# Patient Record
Sex: Male | Born: 1980 | Race: White | Hispanic: No | Marital: Married | Smoking: Never smoker
Health system: Southern US, Community
[De-identification: ages and names within clinical notes are randomized; demographics above are authoritative.]

## PROBLEM LIST (undated history)

## (undated) DIAGNOSIS — R011 Cardiac murmur, unspecified: Secondary | ICD-10-CM

## (undated) DIAGNOSIS — T7840XA Allergy, unspecified, initial encounter: Secondary | ICD-10-CM

## (undated) DIAGNOSIS — T8092XA Unspecified transfusion reaction, initial encounter: Secondary | ICD-10-CM

## (undated) DIAGNOSIS — Q210 Ventricular septal defect: Secondary | ICD-10-CM

## (undated) HISTORY — DX: Allergy, unspecified, initial encounter: T78.40XA

## (undated) HISTORY — DX: Ventricular septal defect: Q21.0

## (undated) HISTORY — DX: Cardiac murmur, unspecified: R01.1

## (undated) HISTORY — DX: Unspecified transfusion reaction, initial encounter: T80.92XA

---
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
## 2005-08-16 ENCOUNTER — Encounter: Admission: RE | Admit: 2005-08-16 | Discharge: 2005-08-16 | Payer: Self-pay | Admitting: *Deleted

## 2007-02-10 IMAGING — CT CT HEAD WO/W CM
1 of 2 series · 13 of 30 positions shown, 17 images · non-contrast
Comparison: none

[Series 2: head · axial · 0.49mm/px · z∈[+26,+139]mm · 13 of 27 slices shown, 17 images]
[im 2/27  brain]
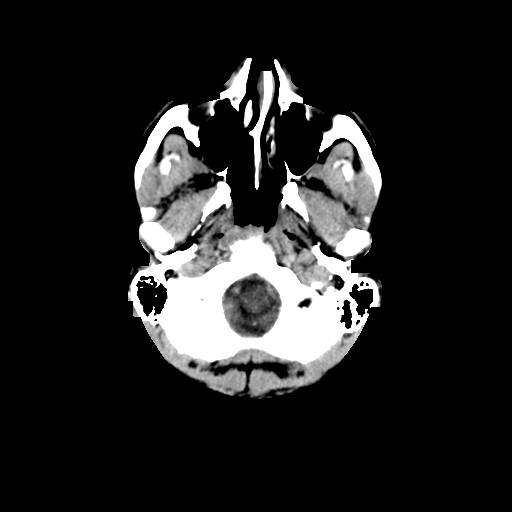
[im 2/27  bone]
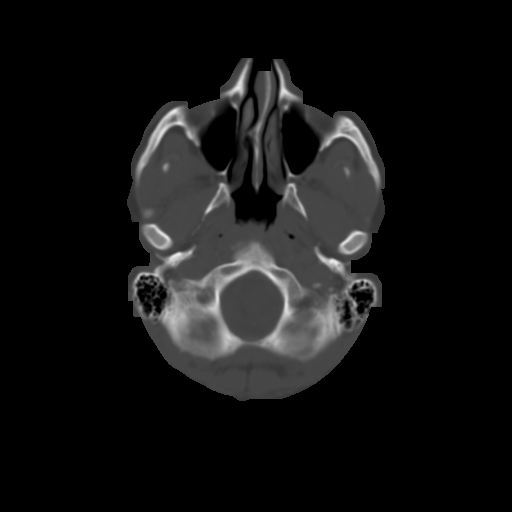
[im 4/27  brain]
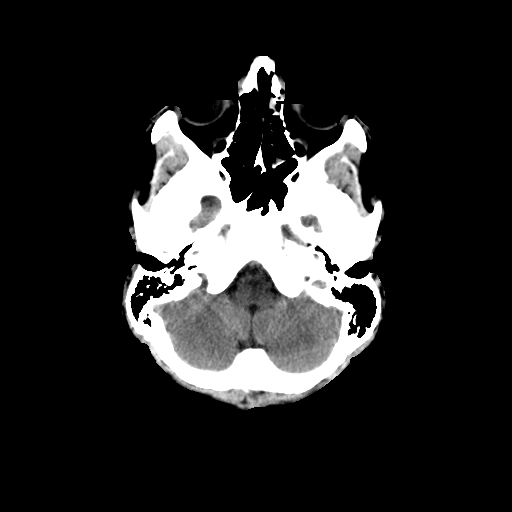
[im 6/27  brain]
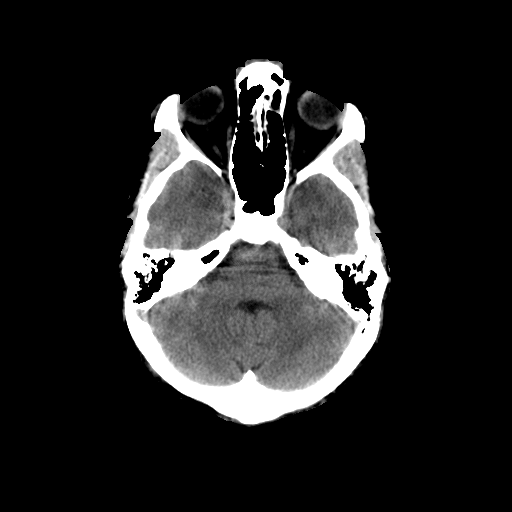
[im 8/27  brain]
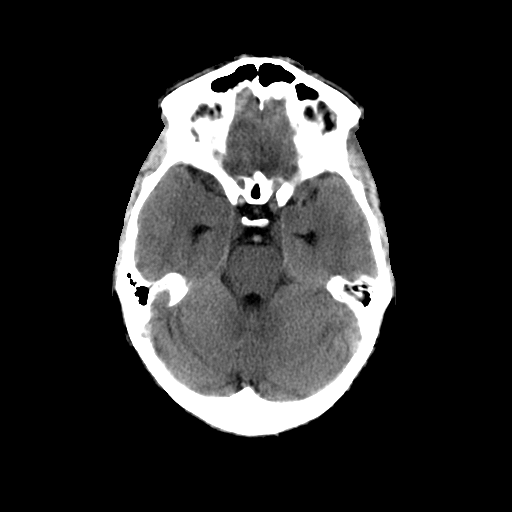
[im 10/27  brain]
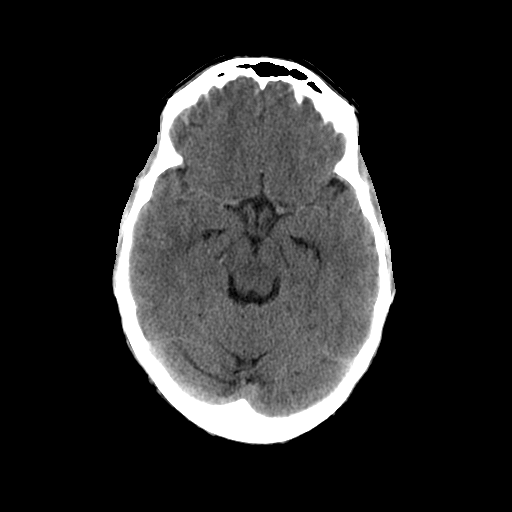
[im 10/27  bone]
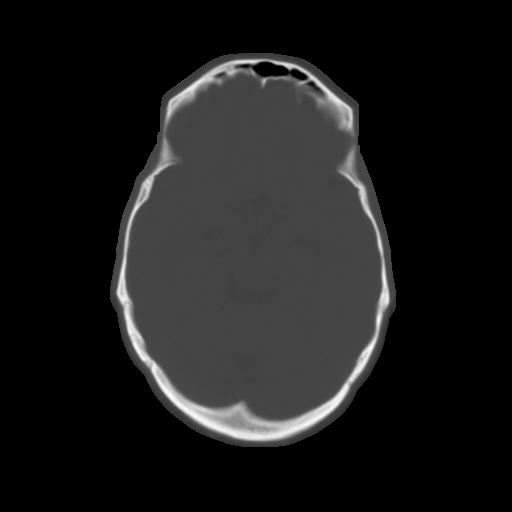
[im 12/27  brain]
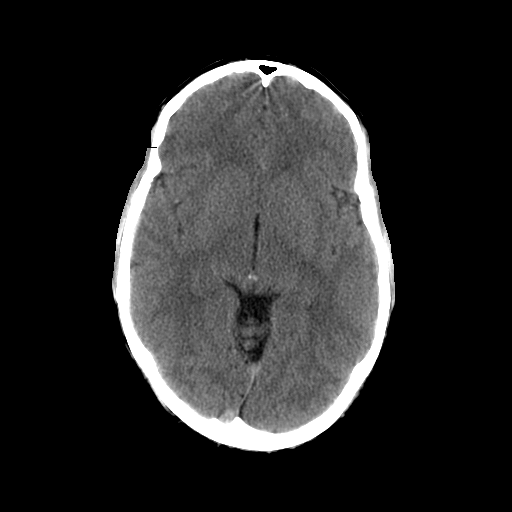
[im 14/27  brain]
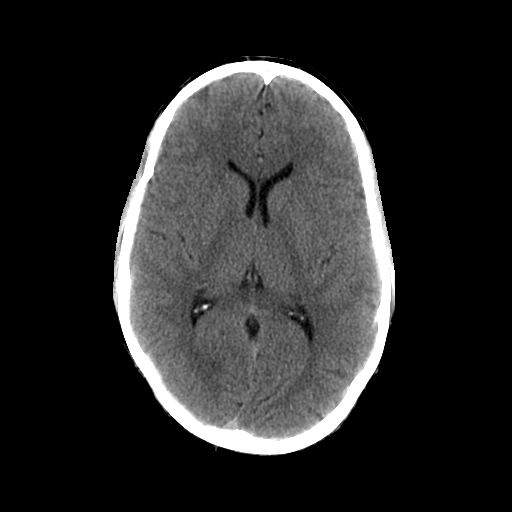
[im 15/27  brain]
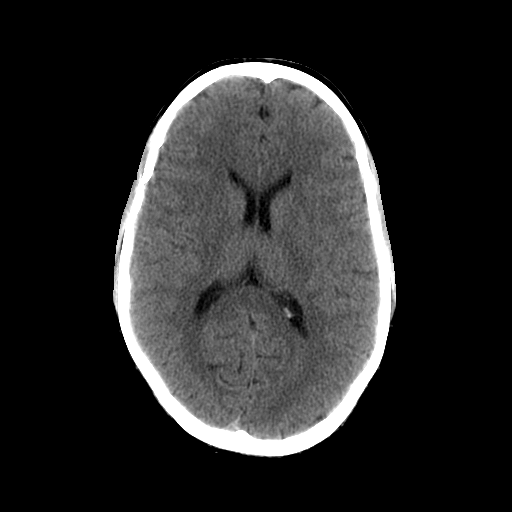
[im 17/27  brain]
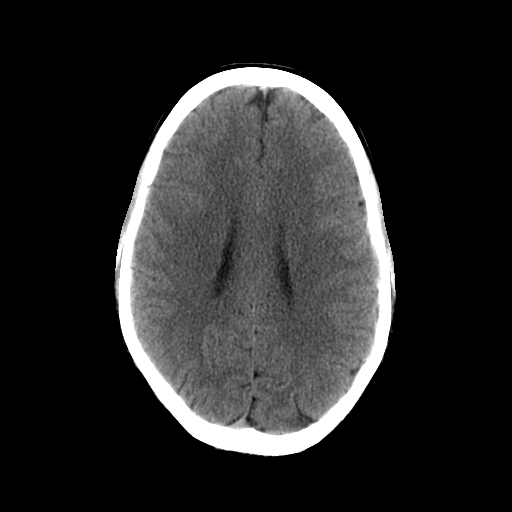
[im 17/27  bone]
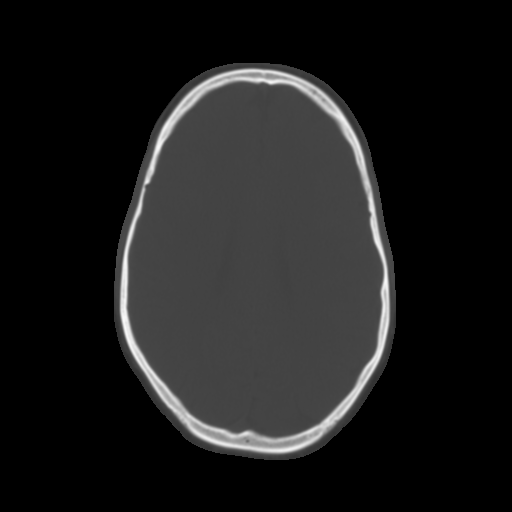
[im 19/27  brain]
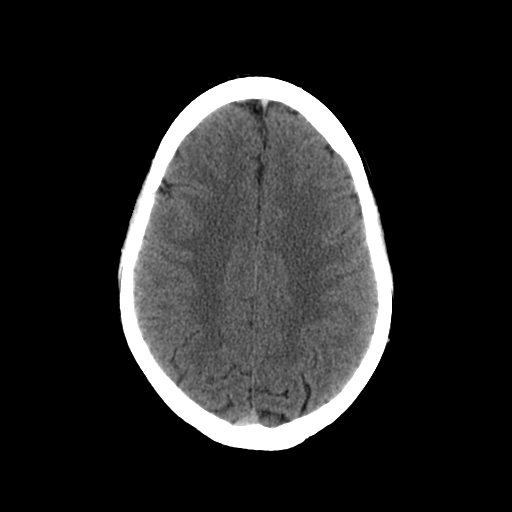
[im 21/27  brain]
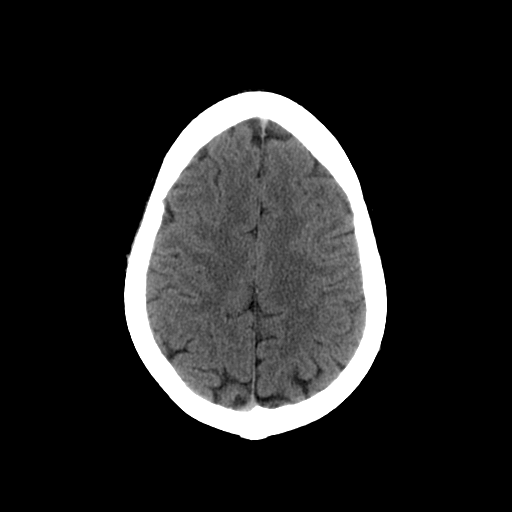
[im 23/27  brain]
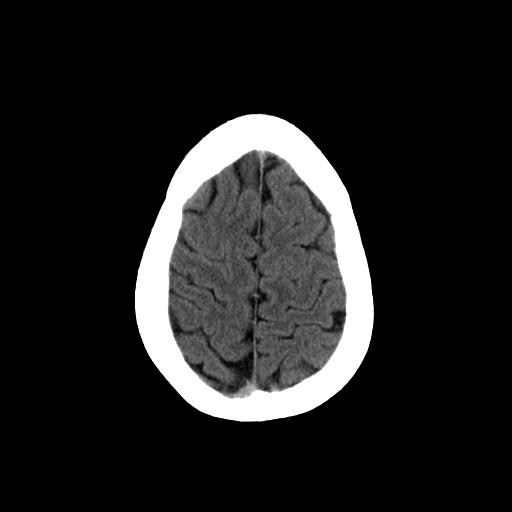
[im 25/27  brain]
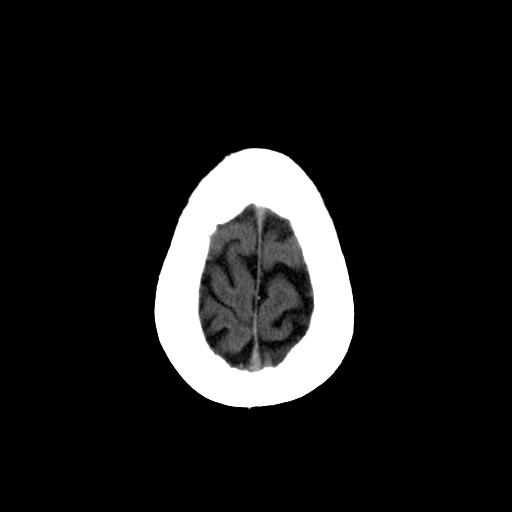
[im 25/27  bone]
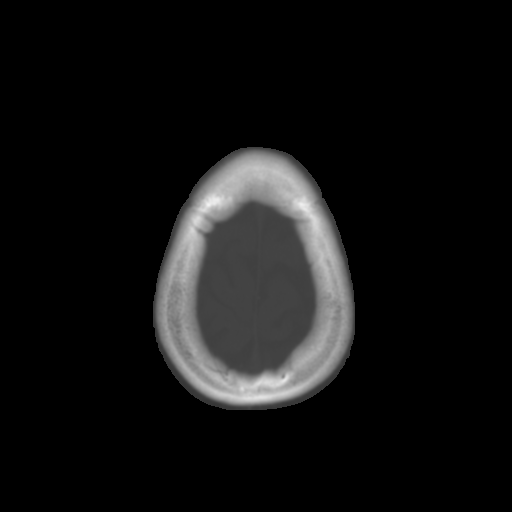

[13 of 30 positions shown; findings below may reference images not displayed]

This examination was performed at [HOSPITAL] at [HOSPITAL].  The interpretation will be provided by [HOSPITAL].

## 2010-04-11 ENCOUNTER — Emergency Department: Payer: Self-pay | Admitting: Emergency Medicine

## 2015-03-11 ENCOUNTER — Telehealth: Payer: Self-pay

## 2015-03-11 NOTE — Telephone Encounter (Signed)
Pre-Visit Call completed with patient and chart updated.   Pre-Visit Info documented in Specialty Comments under [employer].    

## 2015-03-11 NOTE — Telephone Encounter (Signed)
Pt called back. He will call again around 2.

## 2015-03-11 NOTE — Telephone Encounter (Signed)
LMOVM

## 2015-03-12 ENCOUNTER — Ambulatory Visit (INDEPENDENT_AMBULATORY_CARE_PROVIDER_SITE_OTHER): Payer: 59 | Admitting: Family Medicine

## 2015-03-12 ENCOUNTER — Encounter: Payer: Self-pay | Admitting: Family Medicine

## 2015-03-12 VITALS — BP 116/74 | HR 62 | Temp 98.1°F | Ht 65.5 in | Wt 181.6 lb

## 2015-03-12 DIAGNOSIS — Z0000 Encounter for general adult medical examination without abnormal findings: Secondary | ICD-10-CM | POA: Diagnosis not present

## 2015-03-12 LAB — POCT URINALYSIS DIPSTICK
BILIRUBIN UA: NEGATIVE
GLUCOSE UA: NEGATIVE
Ketones, UA: NEGATIVE
Leukocytes, UA: NEGATIVE
Nitrite, UA: NEGATIVE
PH UA: 6
PROTEIN UA: NEGATIVE
RBC UA: NEGATIVE
Urobilinogen, UA: 1

## 2015-03-12 LAB — BASIC METABOLIC PANEL
BUN: 10 mg/dL (ref 6–23)
CO2: 30 meq/L (ref 19–32)
Calcium: 9.5 mg/dL (ref 8.4–10.5)
Chloride: 104 mEq/L (ref 96–112)
Creatinine, Ser: 0.83 mg/dL (ref 0.40–1.50)
GFR: 113.14 mL/min (ref >60.00)
GLUCOSE: 82 mg/dL (ref 70–99)
Potassium: 3.9 mEq/L (ref 3.5–5.1)
SODIUM: 140 meq/L (ref 135–145)

## 2015-03-12 LAB — LIPID PANEL
Cholesterol: 185 mg/dL (ref 0–200)
HDL: 30.3 mg/dL — AB (ref >39.00)
LDL Cholesterol: 123 mg/dL — ABNORMAL HIGH (ref 0–99)
NONHDL: 154.81
Total CHOL/HDL Ratio: 6
Triglycerides: 157 mg/dL — ABNORMAL HIGH (ref 0.0–149.0)
VLDL: 31.4 mg/dL (ref 0.0–40.0)

## 2015-03-12 LAB — HEPATIC FUNCTION PANEL
ALBUMIN: 4.3 g/dL (ref 3.5–5.2)
ALT: 24 U/L (ref 0–53)
AST: 17 U/L (ref 0–37)
Alkaline Phosphatase: 78 U/L (ref 39–117)
BILIRUBIN DIRECT: 0.1 mg/dL (ref 0.0–0.3)
Total Bilirubin: 0.5 mg/dL (ref 0.2–1.2)
Total Protein: 7.1 g/dL (ref 6.0–8.3)

## 2015-03-12 LAB — CBC WITH DIFFERENTIAL/PLATELET
BASOS PCT: 0.5 % (ref 0.0–3.0)
Basophils Absolute: 0 10*3/uL (ref 0.0–0.1)
Eosinophils Absolute: 0.1 10*3/uL (ref 0.0–0.7)
Eosinophils Relative: 1.4 % (ref 0.0–5.0)
HEMATOCRIT: 46.7 % (ref 39.0–52.0)
Hemoglobin: 15.9 g/dL (ref 13.0–17.0)
LYMPHS ABS: 2 10*3/uL (ref 0.7–4.0)
Lymphocytes Relative: 29.7 % (ref 12.0–46.0)
MCHC: 34 g/dL (ref 30.0–36.0)
MCV: 85.3 fl (ref 78.0–100.0)
MONOS PCT: 15.2 % — AB (ref 3.0–12.0)
Monocytes Absolute: 1 10*3/uL (ref 0.1–1.0)
NEUTROS ABS: 3.6 10*3/uL (ref 1.4–7.7)
Neutrophils Relative %: 53.2 % (ref 43.0–77.0)
Platelets: 286 10*3/uL (ref 150.0–400.0)
RBC: 5.48 Mil/uL (ref 4.22–5.81)
RDW: 13.6 % (ref 11.5–15.5)
WBC: 6.8 10*3/uL (ref 4.0–10.5)

## 2015-03-12 LAB — TSH: TSH: 2.97 u[IU]/mL (ref 0.35–4.50)

## 2015-03-12 NOTE — Progress Notes (Signed)
Patient ID: Alex Hamilton, male    DOB: [birth year]  Age: 34 y.o. MRN: [id number]    Subjective:  Subjective HPI Alex Hamilton presents to establish.  He has a hx of heart surgery as a child.   He has a murmur as the result but otherwise has been very healthy.   No complaints.    Review of Systems  Constitutional: Negative.   HENT: Negative for congestion, ear pain, hearing loss, nosebleeds, postnasal drip, rhinorrhea, sinus pressure, sneezing and tinnitus.   Eyes: Negative for photophobia, discharge, itching and visual disturbance.  Respiratory: Negative.   Cardiovascular: Negative.   Gastrointestinal: Negative for abdominal pain, constipation, blood in stool, abdominal distention and anal bleeding.  Endocrine: Negative.   Genitourinary: Negative.   Musculoskeletal: Negative.   Skin: Negative.   Allergic/Immunologic: Negative.   Neurological: Negative for dizziness, weakness, light-headedness, numbness and headaches.  Psychiatric/Behavioral: Negative for suicidal ideas, confusion, sleep disturbance, dysphoric mood, decreased concentration and agitation. The patient is not nervous/anxious.     History Past Medical History  Diagnosis Date  . Allergy     seasonal  . Heart murmur   . Blood transfusion reaction     He has past surgical history that includes Exploration post operative open heart (1992).   His family history includes Brain cancer in his father; Cancer in his paternal uncle and paternal uncle; Heart attack in his father and maternal grandfather; Prostate cancer in his maternal grandfather; Stroke in his maternal grandmother and paternal grandmother.He reports that he has never smoked. He has never used smokeless tobacco. He reports that he drinks alcohol. He reports that he does not use illicit drugs.  Current Outpatient Prescriptions on File Prior to Visit  Medication Sig Dispense Refill  . cetirizine (ZYRTEC) 10 MG tablet Take 10 mg by mouth daily.    .  fluticasone (FLONASE) 50 MCG/ACT nasal spray Place into both nostrils daily.     No current facility-administered medications on file prior to visit.     Objective:  Objective Physical Exam  Constitutional: He is oriented to person, place, and time. He appears well-developed and well-nourished. No distress.  HENT:  Head: Normocephalic and atraumatic.  Right Ear: External ear normal.  Left Ear: External ear normal.  Nose: Nose normal.  Mouth/Throat: Oropharynx is clear and moist. No oropharyngeal exudate.  Eyes: Conjunctivae and EOM are normal. Pupils are equal, round, and reactive to light. Right eye exhibits no discharge. Left eye exhibits no discharge.  Neck: Normal range of motion. Neck supple. No JVD present. No thyromegaly present.  Cardiovascular: Normal rate, regular rhythm and intact distal pulses.  Exam reveals no gallop and no friction rub.   Murmur heard. Pulmonary/Chest: Effort normal and breath sounds normal. No respiratory distress. He has no wheezes. He has no rales. He exhibits no tenderness.  Abdominal: Soft. Bowel sounds are normal. He exhibits no distension and no mass. There is no tenderness. There is no rebound and no guarding.  Genitourinary: Penis normal.  Musculoskeletal: Normal range of motion. He exhibits no edema or tenderness.  Lymphadenopathy:    He has no cervical adenopathy.  Neurological: He is alert and oriented to person, place, and time. He displays normal reflexes. He exhibits normal muscle tone.  Skin: Skin is warm and dry. No rash noted. He is not diaphoretic. No erythema. No pallor.  Psychiatric: He has a normal mood and affect. His behavior is normal. Judgment and thought content normal.   BP 116/74 mmHg  Pulse 62  Temp(Src) 98.1 F (36.7 C) (Oral)  Ht 5' 5.5" (1.664 m)  Wt 181 lb 9.6 oz (82.373 kg)  BMI 29.75 kg/m2  SpO2 98% Wt Readings from Last 3 Encounters:  03/12/15 181 lb 9.6 oz (82.373 kg)     Lab Results  Component Value Date     WBC 6.8 03/12/2015   HGB 15.9 03/12/2015   HCT 46.7 03/12/2015   PLT 286.0 03/12/2015   GLUCOSE 82 03/12/2015   CHOL 185 03/12/2015   TRIG 157.0* 03/12/2015   HDL 30.30* 03/12/2015   LDLCALC 123* 03/12/2015   ALT 24 03/12/2015   AST 17 03/12/2015   NA 140 03/12/2015   K 3.9 03/12/2015   CL 104 03/12/2015   CREATININE 0.83 03/12/2015   BUN 10 03/12/2015   CO2 30 03/12/2015   TSH 2.97 03/12/2015    No results found.   Assessment & Plan:  Plan I am having Mr. Valentina Lucks maintain his fluticasone and cetirizine.  No orders of the defined types were placed in this encounter.    Problem List Items Addressed This Visit    Preventative health care - Primary    See AVS ghm utd Check labs      Relevant Orders   Basic metabolic panel (Completed)   CBC with Differential/Platelet (Completed)   Hepatic function panel (Completed)   Lipid panel (Completed)   POCT urinalysis dipstick (Completed)   TSH (Completed)   HIV antibody    1. Preventative health care   - Basic metabolic panel - CBC with Differential/Platelet - Hepatic function panel - Lipid panel - POCT urinalysis dipstick - TSH - HIV antibody  Follow-up: Return in about 1 year (around 03/11/2016), or if symptoms worsen or fail to improve.  Alex Freud, DO

## 2015-03-12 NOTE — Progress Notes (Signed)
Pre visit review using our clinic review tool, if applicable. No additional management support is needed unless otherwise documented below in the visit note. 

## 2015-03-12 NOTE — Patient Instructions (Signed)
Preventive Care for Adults A healthy lifestyle and preventive care can promote health and wellness. Preventive health guidelines for men include the following key practices:  A routine yearly physical is a good way to check with your health care provider about your health and preventative screening. It is a chance to share any concerns and updates on your health and to receive a thorough exam.  Visit your dentist for a routine exam and preventative care every 6 months. Brush your teeth twice a day and floss once a day. Good oral hygiene prevents tooth decay and gum disease.  The frequency of eye exams is based on your age, health, family medical history, use of contact lenses, and other factors. Follow your health care provider's recommendations for frequency of eye exams.  Eat a healthy diet. Foods such as vegetables, fruits, whole grains, low-fat dairy products, and lean protein foods contain the nutrients you need without too many calories. Decrease your intake of foods high in solid fats, added sugars, and salt. Eat the right amount of calories for you.Get information about a proper diet from your health care provider, if necessary.  Regular physical exercise is one of the most important things you can do for your health. Most adults should get at least 150 minutes of moderate-intensity exercise (any activity that increases your heart rate and causes you to sweat) each week. In addition, most adults need muscle-strengthening exercises on 2 or more days a week.  Maintain a healthy weight. The body mass index (BMI) is a screening tool to identify possible weight problems. It provides an estimate of body fat based on height and weight. Your health care provider can find your BMI and can help you achieve or maintain a healthy weight.For adults 20 years and older:  A BMI below 18.5 is considered underweight.  A BMI of 18.5 to 24.9 is normal.  A BMI of 25 to 29.9 is considered overweight.  A BMI  of 30 and above is considered obese.  Maintain normal blood lipids and cholesterol levels by exercising and minimizing your intake of saturated fat. Eat a balanced diet with plenty of fruit and vegetables. Blood tests for lipids and cholesterol should begin at age 50 and be repeated every 5 years. If your lipid or cholesterol levels are high, you are over 50, or you are at high risk for heart disease, you may need your cholesterol levels checked more frequently.Ongoing high lipid and cholesterol levels should be treated with medicines if diet and exercise are not working.  If you smoke, find out from your health care provider how to quit. If you do not use tobacco, do not start.  Lung cancer screening is recommended for adults aged 73-80 years who are at high risk for developing lung cancer because of a history of smoking. A yearly low-dose CT scan of the lungs is recommended for people who have at least a 30-pack-year history of smoking and are a current smoker or have quit within the past 15 years. A pack year of smoking is smoking an average of 1 pack of cigarettes a day for 1 year (for example: 1 pack a day for 30 years or 2 packs a day for 15 years). Yearly screening should continue until the smoker has stopped smoking for at least 15 years. Yearly screening should be stopped for people who develop a health problem that would prevent them from having lung cancer treatment.  If you choose to drink alcohol, do not have more than  2 drinks per day. One drink is considered to be 12 ounces (355 mL) of beer, 5 ounces (148 mL) of wine, or 1.5 ounces (44 mL) of liquor.  Avoid use of street drugs. Do not share needles with anyone. Ask for help if you need support or instructions about stopping the use of drugs.  High blood pressure causes heart disease and increases the risk of stroke. Your blood pressure should be checked at least every 1-2 years. Ongoing high blood pressure should be treated with  medicines, if weight loss and exercise are not effective.  If you are 45-79 years old, ask your health care provider if you should take aspirin to prevent heart disease.  Diabetes screening involves taking a blood sample to check your fasting blood sugar level. This should be done once every 3 years, after age 45, if you are within normal weight and without risk factors for diabetes. Testing should be considered at a younger age or be carried out more frequently if you are overweight and have at least 1 risk factor for diabetes.  Colorectal cancer can be detected and often prevented. Most routine colorectal cancer screening begins at the age of 50 and continues through age 75. However, your health care provider may recommend screening at an earlier age if you have risk factors for colon cancer. On a yearly basis, your health care provider may provide home test kits to check for hidden blood in the stool. Use of a small camera at the end of a tube to directly examine the colon (sigmoidoscopy or colonoscopy) can detect the earliest forms of colorectal cancer. Talk to your health care provider about this at age 50, when routine screening begins. Direct exam of the colon should be repeated every 5-10 years through age 75, unless early forms of precancerous polyps or small growths are found.  People who are at an increased risk for hepatitis B should be screened for this virus. You are considered at high risk for hepatitis B if:  You were born in a country where hepatitis B occurs often. Talk with your health care provider about which countries are considered high risk.  Your parents were born in a high-risk country and you have not received a shot to protect against hepatitis B (hepatitis B vaccine).  You have HIV or AIDS.  You use needles to inject street drugs.  You live with, or have sex with, someone who has hepatitis B.  You are a man who has sex with other men (MSM).  You get hemodialysis  treatment.  You take certain medicines for conditions such as cancer, organ transplantation, and autoimmune conditions.  Hepatitis C blood testing is recommended for all people born from 1945 through 1965 and any individual with known risks for hepatitis C.  Practice safe sex. Use condoms and avoid high-risk sexual practices to reduce the spread of sexually transmitted infections (STIs). STIs include gonorrhea, chlamydia, syphilis, trichomonas, herpes, HPV, and human immunodeficiency virus (HIV). Herpes, HIV, and HPV are viral illnesses that have no cure. They can result in disability, cancer, and death.  If you are at risk of being infected with HIV, it is recommended that you take a prescription medicine daily to prevent HIV infection. This is called preexposure prophylaxis (PrEP). You are considered at risk if:  You are a man who has sex with other men (MSM) and have other risk factors.  You are a heterosexual man, are sexually active, and are at increased risk for HIV infection.    You take drugs by injection.  You are sexually active with a partner who has HIV.  Talk with your health care provider about whether you are at high risk of being infected with HIV. If you choose to begin PrEP, you should first be tested for HIV. You should then be tested every 3 months for as long as you are taking PrEP.  A one-time screening for abdominal aortic aneurysm (AAA) and surgical repair of large AAAs by ultrasound are recommended for men ages 32 to 67 years who are current or former smokers.  Healthy men should no longer receive prostate-specific antigen (PSA) blood tests as part of routine cancer screening. Talk with your health care provider about prostate cancer screening.  Testicular cancer screening is not recommended for adult males who have no symptoms. Screening includes self-exam, a health care provider exam, and other screening tests. Consult with your health care provider about any symptoms  you have or any concerns you have about testicular cancer.  Use sunscreen. Apply sunscreen liberally and repeatedly throughout the day. You should seek shade when your shadow is shorter than you. Protect yourself by wearing long sleeves, pants, a wide-brimmed hat, and sunglasses year round, whenever you are outdoors.  Once a month, do a whole-body skin exam, using a mirror to look at the skin on your back. Tell your health care provider about new moles, moles that have irregular borders, moles that are larger than a pencil eraser, or moles that have changed in shape or color.  Stay current with required vaccines (immunizations).  Influenza vaccine. All adults should be immunized every year.  Tetanus, diphtheria, and acellular pertussis (Td, Tdap) vaccine. An adult who has not previously received Tdap or who does not know his vaccine status should receive 1 dose of Tdap. This initial dose should be followed by tetanus and diphtheria toxoids (Td) booster doses every 10 years. Adults with an unknown or incomplete history of completing a 3-dose immunization series with Td-containing vaccines should begin or complete a primary immunization series including a Tdap dose. Adults should receive a Td booster every 10 years.  Varicella vaccine. An adult without evidence of immunity to varicella should receive 2 doses or a second dose if he has previously received 1 dose.  Human papillomavirus (HPV) vaccine. Males aged 68-21 years who have not received the vaccine previously should receive the 3-dose series. Males aged 22-26 years may be immunized. Immunization is recommended through the age of 6 years for any male who has sex with males and did not get any or all doses earlier. Immunization is recommended for any person with an immunocompromised condition through the age of 49 years if he did not get any or all doses earlier. During the 3-dose series, the second dose should be obtained 4-8 weeks after the first  dose. The third dose should be obtained 24 weeks after the first dose and 16 weeks after the second dose.  Zoster vaccine. One dose is recommended for adults aged 50 years or older unless certain conditions are present.  Measles, mumps, and rubella (MMR) vaccine. Adults born before [birth year] generally are considered immune to measles and mumps. Adults born in [birth year] or later should have 1 or more doses of MMR vaccine unless there is a contraindication to the vaccine or there is laboratory evidence of immunity to each of the three diseases. A routine second dose of MMR vaccine should be obtained at least 28 days after the first dose for students attending postsecondary  schools, health care workers, or international travelers. People who received inactivated measles vaccine or an unknown type of measles vaccine during 1963-1967 should receive 2 doses of MMR vaccine. People who received inactivated mumps vaccine or an unknown type of mumps vaccine before 1979 and are at high risk for mumps infection should consider immunization with 2 doses of MMR vaccine. Unvaccinated health care workers born before 1957 who lack laboratory evidence of measles, mumps, or rubella immunity or laboratory confirmation of disease should consider measles and mumps immunization with 2 doses of MMR vaccine or rubella immunization with 1 dose of MMR vaccine.  Pneumococcal 13-valent conjugate (PCV13) vaccine. When indicated, a person who is uncertain of his immunization history and has no record of immunization should receive the PCV13 vaccine. An adult aged 19 years or older who has certain medical conditions and has not been previously immunized should receive 1 dose of PCV13 vaccine. This PCV13 should be followed with a dose of pneumococcal polysaccharide (PPSV23) vaccine. The PPSV23 vaccine dose should be obtained at least 8 weeks after the dose of PCV13 vaccine. An adult aged 19 years or older who has certain medical conditions and  previously received 1 or more doses of PPSV23 vaccine should receive 1 dose of PCV13. The PCV13 vaccine dose should be obtained 1 or more years after the last PPSV23 vaccine dose.  Pneumococcal polysaccharide (PPSV23) vaccine. When PCV13 is also indicated, PCV13 should be obtained first. All adults aged 65 years and older should be immunized. An adult younger than age 65 years who has certain medical conditions should be immunized. Any person who resides in a nursing home or long-term care facility should be immunized. An adult smoker should be immunized. People with an immunocompromised condition and certain other conditions should receive both PCV13 and PPSV23 vaccines. People with human immunodeficiency virus (HIV) infection should be immunized as soon as possible after diagnosis. Immunization during chemotherapy or radiation therapy should be avoided. Routine use of PPSV23 vaccine is not recommended for American Indians, [place] Natives, or people younger than 65 years unless there are medical conditions that require PPSV23 vaccine. When indicated, people who have unknown immunization and have no record of immunization should receive PPSV23 vaccine. One-time revaccination 5 years after the first dose of PPSV23 is recommended for people aged 19-64 years who have chronic kidney failure, nephrotic syndrome, asplenia, or immunocompromised conditions. People who received 1-2 doses of PPSV23 before age 65 years should receive another dose of PPSV23 vaccine at age 65 years or later if at least 5 years have passed since the previous dose. Doses of PPSV23 are not needed for people immunized with PPSV23 at or after age 65 years.  Meningococcal vaccine. Adults with asplenia or persistent complement component deficiencies should receive 2 doses of quadrivalent meningococcal conjugate (MenACWY-D) vaccine. The doses should be obtained at least 2 months apart. Microbiologists working with certain meningococcal bacteria,  military recruits, people at risk during an outbreak, and people who travel to or live in countries with a high rate of meningitis should be immunized. A first-year college student up through age 21 years who is living in a residence hall should receive a dose if he did not receive a dose on or after his 16th birthday. Adults who have certain high-risk conditions should receive one or more doses of vaccine.  Hepatitis A vaccine. Adults who wish to be protected from this disease, have certain high-risk conditions, work with hepatitis A-infected animals, work in hepatitis A research labs, or   travel to or work in countries with a high rate of hepatitis A should be immunized. Adults who were previously unvaccinated and who anticipate close contact with an international adoptee during the first 60 days after arrival in the [place] from a country with a high rate of hepatitis A should be immunized.  Hepatitis B vaccine. Adults should be immunized if they wish to be protected from this disease, have certain high-risk conditions, may be exposed to blood or other infectious body fluids, are household contacts or sex partners of hepatitis B positive people, are clients or workers in certain care facilities, or travel to or work in countries with a high rate of hepatitis B.  Haemophilus influenzae type b (Hib) vaccine. A previously unvaccinated person with asplenia or sickle cell disease or having a scheduled splenectomy should receive 1 dose of Hib vaccine. Regardless of previous immunization, a recipient of a hematopoietic stem cell transplant should receive a 3-dose series 6-12 months after his successful transplant. Hib vaccine is not recommended for adults with HIV infection. Preventive Service / Frequency Ages 52 to 17  Blood pressure check.** / Every 1 to 2 years.  Lipid and cholesterol check.** / Every 5 years beginning at age 69.  Hepatitis C blood test.** / For any individual with known risks for  hepatitis C.  Skin self-exam. / Monthly.  Influenza vaccine. / Every year.  Tetanus, diphtheria, and acellular pertussis (Tdap, Td) vaccine.** / Consult your health care provider. 1 dose of Td every 10 years.  Varicella vaccine.** / Consult your health care provider.  HPV vaccine. / 3 doses over 6 months, if 72 or younger.  Measles, mumps, rubella (MMR) vaccine.** / You need at least 1 dose of MMR if you were born in 1957 or later. You may also need a second dose.  Pneumococcal 13-valent conjugate (PCV13) vaccine.** / Consult your health care provider.  Pneumococcal polysaccharide (PPSV23) vaccine.** / 1 to 2 doses if you smoke cigarettes or if you have certain conditions.  Meningococcal vaccine.** / 1 dose if you are age 35 to 60 years and a Market researcher living in a residence hall, or have one of several medical conditions. You may also need additional booster doses.  Hepatitis A vaccine.** / Consult your health care provider.  Hepatitis B vaccine.** / Consult your health care provider.  Haemophilus influenzae type b (Hib) vaccine.** / Consult your health care provider. Ages 35 to 8  Blood pressure check.** / Every 1 to 2 years.  Lipid and cholesterol check.** / Every 5 years beginning at age 57.  Lung cancer screening. / Every year if you are aged 44-80 years and have a 30-pack-year history of smoking and currently smoke or have quit within the past 15 years. Yearly screening is stopped once you have quit smoking for at least 15 years or develop a health problem that would prevent you from having lung cancer treatment.  Fecal occult blood test (FOBT) of stool. / Every year beginning at age 55 and continuing until age 73. You may not have to do this test if you get a colonoscopy every 10 years.  Flexible sigmoidoscopy** or colonoscopy.** / Every 5 years for a flexible sigmoidoscopy or every 10 years for a colonoscopy beginning at age 28 and continuing until age  1.  Hepatitis C blood test.** / For all people born from [birth year] through 1965 and any individual with known risks for hepatitis C.  Skin self-exam. / Monthly.  Influenza vaccine. / Every  year.  Tetanus, diphtheria, and acellular pertussis (Tdap/Td) vaccine.** / Consult your health care provider. 1 dose of Td every 10 years.  Varicella vaccine.** / Consult your health care provider.  Zoster vaccine.** / 1 dose for adults aged 53 years or older.  Measles, mumps, rubella (MMR) vaccine.** / You need at least 1 dose of MMR if you were born in 1957 or later. You may also need a second dose.  Pneumococcal 13-valent conjugate (PCV13) vaccine.** / Consult your health care provider.  Pneumococcal polysaccharide (PPSV23) vaccine.** / 1 to 2 doses if you smoke cigarettes or if you have certain conditions.  Meningococcal vaccine.** / Consult your health care provider.  Hepatitis A vaccine.** / Consult your health care provider.  Hepatitis B vaccine.** / Consult your health care provider.  Haemophilus influenzae type b (Hib) vaccine.** / Consult your health care provider. Ages 77 and over  Blood pressure check.** / Every 1 to 2 years.  Lipid and cholesterol check.**/ Every 5 years beginning at age 85.  Lung cancer screening. / Every year if you are aged 55-80 years and have a 30-pack-year history of smoking and currently smoke or have quit within the past 15 years. Yearly screening is stopped once you have quit smoking for at least 15 years or develop a health problem that would prevent you from having lung cancer treatment.  Fecal occult blood test (FOBT) of stool. / Every year beginning at age 33 and continuing until age 11. You may not have to do this test if you get a colonoscopy every 10 years.  Flexible sigmoidoscopy** or colonoscopy.** / Every 5 years for a flexible sigmoidoscopy or every 10 years for a colonoscopy beginning at age 28 and continuing until age 73.  Hepatitis C blood  test.** / For all people born from [birth year] through 1965 and any individual with known risks for hepatitis C.  Abdominal aortic aneurysm (AAA) screening.** / A one-time screening for ages 50 to 27 years who are current or former smokers.  Skin self-exam. / Monthly.  Influenza vaccine. / Every year.  Tetanus, diphtheria, and acellular pertussis (Tdap/Td) vaccine.** / 1 dose of Td every 10 years.  Varicella vaccine.** / Consult your health care provider.  Zoster vaccine.** / 1 dose for adults aged 34 years or older.  Pneumococcal 13-valent conjugate (PCV13) vaccine.** / Consult your health care provider.  Pneumococcal polysaccharide (PPSV23) vaccine.** / 1 dose for all adults aged 63 years and older.  Meningococcal vaccine.** / Consult your health care provider.  Hepatitis A vaccine.** / Consult your health care provider.  Hepatitis B vaccine.** / Consult your health care provider.  Haemophilus influenzae type b (Hib) vaccine.** / Consult your health care provider. **Family history and personal history of risk and conditions may change your health care provider's recommendations. Document Released: 09/19/2001 Document Revised: 07/29/2013 Document Reviewed: 12/19/2010 [facility] Patient Information 2015 [place], [place]. This information is not intended to replace advice given to you by your health care provider. Make sure you discuss any questions you have with your health care provider.

## 2015-03-12 NOTE — Assessment & Plan Note (Signed)
See AVS ghm utd Check labs 

## 2015-03-13 LAB — HIV ANTIBODY (ROUTINE TESTING W REFLEX): HIV: NONREACTIVE

## 2015-03-19 ENCOUNTER — Telehealth: Payer: Self-pay | Admitting: Family Medicine

## 2015-03-19 NOTE — Telephone Encounter (Signed)
Relation to pt: self  Call back number: [phone]  Reason for call:  Patient returning Alex [facility] called regarding patient lab results. Patient states he is about to mow the lawn and will call back Monday

## 2015-03-19 NOTE — Telephone Encounter (Signed)
Patient reviewed his labs on my-chart.      KP

## 2015-09-30 DIAGNOSIS — B079 Viral wart, unspecified: Secondary | ICD-10-CM | POA: Diagnosis not present

## 2015-10-22 DIAGNOSIS — B079 Viral wart, unspecified: Secondary | ICD-10-CM | POA: Diagnosis not present

## 2015-11-12 DIAGNOSIS — Z3184 Encounter for fertility preservation procedure: Secondary | ICD-10-CM | POA: Diagnosis not present

## 2015-11-12 DIAGNOSIS — Z113 Encounter for screening for infections with a predominantly sexual mode of transmission: Secondary | ICD-10-CM | POA: Diagnosis not present

## 2015-12-04 DIAGNOSIS — Z3189 Encounter for other procreative management: Secondary | ICD-10-CM | POA: Diagnosis not present

## 2016-03-16 ENCOUNTER — Encounter: Payer: Self-pay | Admitting: Family Medicine

## 2016-03-16 ENCOUNTER — Ambulatory Visit (INDEPENDENT_AMBULATORY_CARE_PROVIDER_SITE_OTHER): Payer: 59 | Admitting: Family Medicine

## 2016-03-16 VITALS — BP 110/74 | HR 67 | Temp 97.7°F | Ht 66.0 in | Wt 194.2 lb

## 2016-03-16 DIAGNOSIS — Z0000 Encounter for general adult medical examination without abnormal findings: Secondary | ICD-10-CM | POA: Diagnosis not present

## 2016-03-16 LAB — CBC WITH DIFFERENTIAL/PLATELET
BASOS ABS: 0 10*3/uL (ref 0.0–0.1)
Basophils Relative: 0.4 % (ref 0.0–3.0)
EOS ABS: 0.1 10*3/uL (ref 0.0–0.7)
Eosinophils Relative: 0.8 % (ref 0.0–5.0)
HEMATOCRIT: 48.4 % (ref 39.0–52.0)
HEMOGLOBIN: 16.3 g/dL (ref 13.0–17.0)
LYMPHS PCT: 22.5 % (ref 12.0–46.0)
Lymphs Abs: 1.9 10*3/uL (ref 0.7–4.0)
MCHC: 33.7 g/dL (ref 30.0–36.0)
MCV: 86.6 fl (ref 78.0–100.0)
MONOS PCT: 11.1 % (ref 3.0–12.0)
Monocytes Absolute: 1 10*3/uL (ref 0.1–1.0)
NEUTROS ABS: 5.6 10*3/uL (ref 1.4–7.7)
Neutrophils Relative %: 65.2 % (ref 43.0–77.0)
PLATELETS: 297 10*3/uL (ref 150.0–400.0)
RBC: 5.59 Mil/uL (ref 4.22–5.81)
RDW: 13.1 % (ref 11.5–15.5)
WBC: 8.6 10*3/uL (ref 4.0–10.5)

## 2016-03-16 LAB — POCT URINALYSIS DIPSTICK
BILIRUBIN UA: NEGATIVE
GLUCOSE UA: NEGATIVE
KETONES UA: NEGATIVE
Leukocytes, UA: NEGATIVE
Nitrite, UA: NEGATIVE
PH UA: 6
Protein, UA: NEGATIVE
RBC UA: NEGATIVE
SPEC GRAV UA: 1.01
Urobilinogen, UA: 0.2

## 2016-03-16 LAB — COMPREHENSIVE METABOLIC PANEL
ALBUMIN: 4.6 g/dL (ref 3.5–5.2)
ALT: 33 U/L (ref 0–53)
AST: 20 U/L (ref 0–37)
Alkaline Phosphatase: 68 U/L (ref 39–117)
BILIRUBIN TOTAL: 0.6 mg/dL (ref 0.2–1.2)
BUN: 12 mg/dL (ref 6–23)
CALCIUM: 10 mg/dL (ref 8.4–10.5)
CHLORIDE: 102 meq/L (ref 96–112)
CO2: 31 meq/L (ref 19–32)
CREATININE: 0.86 mg/dL (ref 0.40–1.50)
GFR: 107.94 mL/min (ref >60.00)
Glucose, Bld: 92 mg/dL (ref 70–99)
Potassium: 4.2 mEq/L (ref 3.5–5.1)
Sodium: 141 mEq/L (ref 135–145)
Total Protein: 7.7 g/dL (ref 6.0–8.3)

## 2016-03-16 LAB — LIPID PANEL
CHOL/HDL RATIO: 5
Cholesterol: 214 mg/dL — ABNORMAL HIGH (ref 0–200)
HDL: 41.2 mg/dL (ref >39.00)
LDL CALC: 143 mg/dL — AB (ref 0–99)
NONHDL: 172.31
TRIGLYCERIDES: 149 mg/dL (ref 0.0–149.0)
VLDL: 29.8 mg/dL (ref 0.0–40.0)

## 2016-03-16 LAB — TSH: TSH: 4.15 u[IU]/mL (ref 0.35–4.50)

## 2016-03-16 NOTE — Patient Instructions (Signed)

## 2016-03-16 NOTE — Progress Notes (Signed)
Pre visit review using our clinic review tool, if applicable. No additional management support is needed unless otherwise documented below in the visit note. 

## 2016-03-16 NOTE — Assessment & Plan Note (Signed)
ghm utd Check labs See AVS 

## 2016-03-16 NOTE — Progress Notes (Signed)
Patient ID: Alex Hamilton, male    DOB: 1981/04/02  Age: 35 y.o. MRN: [id number]    Subjective:  Subjective  HPI Alex DistanceMatthew Hamilton presents for cpe.   No complaints.   Review of Systems  Constitutional: Negative.   HENT: Negative for congestion, ear pain, hearing loss, nosebleeds, postnasal drip, rhinorrhea, sinus pressure, sneezing and tinnitus.   Eyes: Negative for photophobia, discharge, itching and visual disturbance.  Respiratory: Negative.   Cardiovascular: Negative.   Gastrointestinal: Negative for abdominal distention, abdominal pain, anal bleeding, blood in stool and constipation.  Endocrine: Negative.   Genitourinary: Negative.   Musculoskeletal: Negative.   Skin: Negative.   Allergic/Immunologic: Negative.   Neurological: Negative for dizziness, weakness, light-headedness, numbness and headaches.  Psychiatric/Behavioral: Negative for agitation, confusion, decreased concentration, dysphoric mood, sleep disturbance and suicidal ideas. The patient is not nervous/anxious.     History Past Medical History:  Diagnosis Date  . Allergy    seasonal  . Blood transfusion reaction   . Heart murmur     He has a past surgical history that includes Exploration post operative open heart (1992).   His family history includes Brain cancer (age of onset: 7460) in his father; Cancer in his paternal uncle and paternal uncle; Colon cancer (age of onset: 7965) in his mother; Heart attack in his father and maternal grandfather; Lung cancer (age of onset: 3269) in his father; Prostate cancer in his maternal grandfather; Stroke in his maternal grandmother and paternal grandmother.He reports that he has never smoked. He has never used smokeless tobacco. He reports that he drinks alcohol. He reports that he does not use drugs.    Current Outpatient Prescriptions on File Prior to Visit  Medication Sig Dispense Refill  . cetirizine (ZYRTEC) 10 MG tablet Take 10 mg by mouth daily.    . fluticasone  (FLONASE) 50 MCG/ACT nasal spray Place into both nostrils daily.     No current facility-administered medications on file prior to visit.      Objective:  Objective  Physical Exam  Constitutional: He is oriented to person, place, and time. He appears well-developed and well-nourished. No distress.  HENT:  Head: Normocephalic and atraumatic.  Right Ear: External ear normal.  Left Ear: External ear normal.  Nose: Nose normal.  Mouth/Throat: Oropharynx is clear and moist. No oropharyngeal exudate.  Eyes: Conjunctivae and EOM are normal. Pupils are equal, round, and reactive to light. Right eye exhibits no discharge. Left eye exhibits no discharge.  Neck: Normal range of motion. Neck supple. No JVD present. No thyromegaly present.  Cardiovascular: Normal rate, regular rhythm and intact distal pulses.  Exam reveals no gallop and no friction rub.   Murmur heard. Pulmonary/Chest: Effort normal and breath sounds normal. No respiratory distress. He has no wheezes. He has no rales. He exhibits no tenderness.  Abdominal: Soft. Bowel sounds are normal. He exhibits no distension and no mass. There is no tenderness. There is no rebound and no guarding.  Genitourinary: Rectum normal, prostate normal and penis normal.  Musculoskeletal: Normal range of motion. He exhibits no edema or tenderness.  Lymphadenopathy:    He has no cervical adenopathy.  Neurological: He is alert and oriented to person, place, and time. He displays normal reflexes. He exhibits normal muscle tone.  Skin: Skin is warm and dry. No rash noted. He is not diaphoretic. No erythema. No pallor.  Psychiatric: He has a normal mood and affect. His behavior is normal. Judgment and thought content normal.  Nursing note and vitals  reviewed.  BP 110/74 (BP Location: Left Arm, Patient Position: Sitting, Cuff Size: Normal)   Pulse 67   Temp 97.7 F (36.5 C) (Oral)   Ht  (1.676 m)   Wt 194 lb 3.2 oz (88.1 kg)   SpO2 97%   BMI 31.34  kg/m  Wt Readings from Last 3 Encounters:  03/16/16 194 lb 3.2 oz (88.1 kg)  03/12/15 181 lb 9.6 oz (82.4 kg)     Lab Results  Component Value Date   WBC 6.8 03/12/2015   HGB 15.9 03/12/2015   HCT 46.7 03/12/2015   PLT 286.0 03/12/2015   GLUCOSE 82 03/12/2015   CHOL 185 03/12/2015   TRIG 157.0 (H) 03/12/2015   HDL 30.30 (L) 03/12/2015   LDLCALC 123 (H) 03/12/2015   ALT 24 03/12/2015   AST 17 03/12/2015   NA 140 03/12/2015   K 3.9 03/12/2015   CL 104 03/12/2015   CREATININE 0.83 03/12/2015   BUN 10 03/12/2015   CO2 30 03/12/2015   TSH 2.97 03/12/2015    No results found.   Assessment & Plan:  Plan  I am having Alex Hamilton maintain his fluticasone and cetirizine.  No orders of the defined types were placed in this encounter.   Problem List Items Addressed This Visit      Unprioritized   Preventative health care - Primary    ghm utd Check labs  See AVS      Relevant Orders   Comprehensive metabolic panel   CBC with Differential/Platelet   Lipid panel   POCT urinalysis dipstick   TSH    Other Visit Diagnoses   None.    1. Preventative health care ghm utd See above - Comprehensive metabolic panel - CBC with Differential/Platelet - Lipid panel - POCT urinalysis dipstick - TSH  Follow-up: Return in about 1 year (around 03/16/2017) for annual exam, fasting.  Donato Schultz, DO    +---

## 2016-06-08 DIAGNOSIS — B079 Viral wart, unspecified: Secondary | ICD-10-CM | POA: Diagnosis not present

## 2016-06-23 DIAGNOSIS — Z31441 Encounter for testing of male partner of patient with recurrent pregnancy loss: Secondary | ICD-10-CM | POA: Diagnosis not present

## 2016-07-08 DIAGNOSIS — Z3189 Encounter for other procreative management: Secondary | ICD-10-CM | POA: Diagnosis not present

## 2017-03-19 ENCOUNTER — Ambulatory Visit (INDEPENDENT_AMBULATORY_CARE_PROVIDER_SITE_OTHER): Payer: 59 | Admitting: Family Medicine

## 2017-03-19 ENCOUNTER — Encounter: Payer: Self-pay | Admitting: Family Medicine

## 2017-03-19 VITALS — BP 108/68 | HR 71 | Temp 98.1°F | Ht 63.0 in | Wt 208.1 lb

## 2017-03-19 DIAGNOSIS — Z23 Encounter for immunization: Secondary | ICD-10-CM

## 2017-03-19 DIAGNOSIS — Z0000 Encounter for general adult medical examination without abnormal findings: Secondary | ICD-10-CM | POA: Diagnosis not present

## 2017-03-19 LAB — LIPID PANEL
CHOL/HDL RATIO: 5
Cholesterol: 177 mg/dL (ref 0–200)
HDL: 37.2 mg/dL — ABNORMAL LOW (ref >39.00)
LDL CALC: 122 mg/dL — AB (ref 0–99)
NonHDL: 139.85
TRIGLYCERIDES: 87 mg/dL (ref 0.0–149.0)
VLDL: 17.4 mg/dL (ref 0.0–40.0)

## 2017-03-19 LAB — POC URINALSYSI DIPSTICK (AUTOMATED)
Bilirubin, UA: NEGATIVE
Blood, UA: NEGATIVE
Glucose, UA: NEGATIVE
KETONES UA: NEGATIVE
LEUKOCYTES UA: NEGATIVE
NITRITE UA: NEGATIVE
PROTEIN UA: NEGATIVE
Spec Grav, UA: >=1.03 — AB (ref 1.010–1.025)
Urobilinogen, UA: 0.2 E.U./dL
pH, UA: 6 (ref 5.0–8.0)

## 2017-03-19 LAB — CBC WITH DIFFERENTIAL/PLATELET
BASOS PCT: 0.7 % (ref 0.0–3.0)
Basophils Absolute: 0.1 10*3/uL (ref 0.0–0.1)
EOS PCT: 2.1 % (ref 0.0–5.0)
Eosinophils Absolute: 0.2 10*3/uL (ref 0.0–0.7)
HEMATOCRIT: 47 % (ref 39.0–52.0)
HEMOGLOBIN: 15.8 g/dL (ref 13.0–17.0)
LYMPHS PCT: 23.2 % (ref 12.0–46.0)
Lymphs Abs: 1.8 10*3/uL (ref 0.7–4.0)
MCHC: 33.7 g/dL (ref 30.0–36.0)
MCV: 88 fl (ref 78.0–100.0)
MONOS PCT: 13.6 % — AB (ref 3.0–12.0)
Monocytes Absolute: 1 10*3/uL (ref 0.1–1.0)
Neutro Abs: 4.6 10*3/uL (ref 1.4–7.7)
Neutrophils Relative %: 60.4 % (ref 43.0–77.0)
Platelets: 286 10*3/uL (ref 150.0–400.0)
RBC: 5.34 Mil/uL (ref 4.22–5.81)
RDW: 13.1 % (ref 11.5–15.5)
WBC: 7.6 10*3/uL (ref 4.0–10.5)

## 2017-03-19 LAB — COMPREHENSIVE METABOLIC PANEL
ALT: 37 U/L (ref 0–53)
AST: 20 U/L (ref 0–37)
Albumin: 4.2 g/dL (ref 3.5–5.2)
Alkaline Phosphatase: 58 U/L (ref 39–117)
BUN: 12 mg/dL (ref 6–23)
CALCIUM: 9 mg/dL (ref 8.4–10.5)
CHLORIDE: 107 meq/L (ref 96–112)
CO2: 23 meq/L (ref 19–32)
CREATININE: 0.75 mg/dL (ref 0.40–1.50)
GFR: 125.67 mL/min (ref >60.00)
Glucose, Bld: 106 mg/dL — ABNORMAL HIGH (ref 70–99)
Potassium: 4 mEq/L (ref 3.5–5.1)
SODIUM: 139 meq/L (ref 135–145)
Total Bilirubin: 0.3 mg/dL (ref 0.2–1.2)
Total Protein: 6.4 g/dL (ref 6.0–8.3)

## 2017-03-19 LAB — TSH: TSH: 4.06 u[IU]/mL (ref 0.35–4.50)

## 2017-03-19 NOTE — Patient Instructions (Signed)
Preventive Care 18-39 Years, Male Preventive care refers to lifestyle choices and visits with your health care provider that can promote health and wellness. What does preventive care include?  A yearly physical exam. This is also called an annual well check.  Dental exams once or twice a year.  Routine eye exams. Ask your health care provider how often you should have your eyes checked.  Personal lifestyle choices, including: ? Daily care of your teeth and gums. ? Regular physical activity. ? Eating a healthy diet. ? Avoiding tobacco and drug use. ? Limiting alcohol use. ? Practicing safe sex. What happens during an annual well check? The services and screenings done by your health care provider during your annual well check will depend on your age, overall health, lifestyle risk factors, and family history of disease. Counseling Your health care provider may ask you questions about your:  Alcohol use.  Tobacco use.  Drug use.  Emotional well-being.  Home and relationship well-being.  Sexual activity.  Eating habits.  Work and work environment.  Screening You may have the following tests or measurements:  Height, weight, and BMI.  Blood pressure.  Lipid and cholesterol levels. These may be checked every 5 years starting at age 20.  Diabetes screening. This is done by checking your blood sugar (glucose) after you have not eaten for a while (fasting).  Skin check.  Hepatitis C blood test.  Hepatitis B blood test.  Sexually transmitted disease (STD) testing.  Discuss your test results, treatment options, and if necessary, the need for more tests with your health care provider. Vaccines Your health care provider may recommend certain vaccines, such as:  Influenza vaccine. This is recommended every year.  Tetanus, diphtheria, and acellular pertussis (Tdap, Td) vaccine. You may need a Td booster every 10 years.  Varicella vaccine. You may need this if you  have not been vaccinated.  HPV vaccine. If you are 26 or younger, you may need three doses over 6 months.  Measles, mumps, and rubella (MMR) vaccine. You may need at least one dose of MMR.You may also need a second dose.  Pneumococcal 13-valent conjugate (PCV13) vaccine. You may need this if you have certain conditions and have not been vaccinated.  Pneumococcal polysaccharide (PPSV23) vaccine. You may need one or two doses if you smoke cigarettes or if you have certain conditions.  Meningococcal vaccine. One dose is recommended if you are age 19-21 years and a first-year college student living in a residence hall, or if you have one of several medical conditions. You may also need additional booster doses.  Hepatitis A vaccine. You may need this if you have certain conditions or if you travel or work in places where you may be exposed to hepatitis A.  Hepatitis B vaccine. You may need this if you have certain conditions or if you travel or work in places where you may be exposed to hepatitis B.  Haemophilus influenzae type b (Hib) vaccine. You may need this if you have certain risk factors.  Talk to your health care provider about which screenings and vaccines you need and how often you need them. This information is not intended to replace advice given to you by your health care provider. Make sure you discuss any questions you have with your health care provider. Document Released: 09/19/2001 Document Revised: 04/12/2016 Document Reviewed: 05/25/2015 Elsevier Interactive Patient Education  2017 Else[employer] Inc.  

## 2017-03-19 NOTE — Assessment & Plan Note (Signed)
ghm utd Check labs Pt understands how to do and states he does check regular testicular exams-- he preferred not to have GU exam today and is not having any problems

## 2017-03-19 NOTE — Progress Notes (Signed)
Patient ID: Alex Hamilton, male    DOB: 23-Sep-1980  Age: 36 y.o. MRN: [id number]    Subjective:  Subjective  HPI Zac Torti presents for cpe.  No complaints.    Review of Systems  Constitutional: Negative for chills and fever.  HENT: Negative for congestion and hearing loss.   Eyes: Negative for discharge.  Respiratory: Negative for cough and shortness of breath.   Cardiovascular: Negative for chest pain, palpitations and leg swelling.  Gastrointestinal: Negative for abdominal pain, blood in stool, constipation, diarrhea, nausea and vomiting.  Genitourinary: Negative for dysuria, frequency, hematuria and urgency.  Musculoskeletal: Negative for back pain and myalgias.  Skin: Negative for rash.  Allergic/Immunologic: Negative for environmental allergies.  Neurological: Negative for dizziness, weakness and headaches.  Hematological: Does not bruise/bleed easily.  Psychiatric/Behavioral: Negative for suicidal ideas. The patient is not nervous/anxious.     History Past Medical History:  Diagnosis Date  . Allergy    seasonal  . Blood transfusion reaction   . Heart murmur     He has a past surgical history that includes Exploration post operative open heart (1992).   His family history includes Brain cancer (age of onset: 60) in his father; Cancer in his paternal uncle and paternal uncle; Colon cancer (age of onset: 35) in his mother; Heart attack in his maternal grandfather; Heart attack (age of onset: 23) in his father; Lung cancer (age of onset: 60) in his father; Prostate cancer in his maternal grandfather; Stroke in his maternal grandmother and paternal grandmother.He reports that he has never smoked. He has never used smokeless tobacco. He reports that he drinks alcohol. He reports that he does not use drugs.  Current Outpatient Prescriptions on File Prior to Visit  Medication Sig Dispense Refill  . cetirizine (ZYRTEC) 10 MG tablet Take 10 mg by mouth daily.    .  fluticasone (FLONASE) 50 MCG/ACT nasal spray Place into both nostrils daily.     No current facility-administered medications on file prior to visit.      Objective:  Objective  Physical Exam  Constitutional: He is oriented to person, place, and time. He appears well-developed and well-nourished. No distress.  HENT:  Head: Normocephalic and atraumatic.  Right Ear: External ear normal.  Left Ear: External ear normal.  Nose: Nose normal.  Mouth/Throat: Oropharynx is clear and moist. No oropharyngeal exudate.  Eyes: Pupils are equal, round, and reactive to light. Conjunctivae and EOM are normal. Right eye exhibits no discharge. Left eye exhibits no discharge.  Neck: Normal range of motion. Neck supple. No JVD present. No thyromegaly present.  Cardiovascular: Normal rate, regular rhythm and intact distal pulses.  Exam reveals no gallop and no friction rub.   Murmur heard. Pulmonary/Chest: Effort normal and breath sounds normal. No respiratory distress. He has no wheezes. He has no rales. He exhibits no tenderness.  Abdominal: Soft. Bowel sounds are normal. He exhibits no distension and no mass. There is no tenderness. There is no rebound and no guarding.  Genitourinary: Rectum normal.  Genitourinary Comments: deferred  Musculoskeletal: Normal range of motion. He exhibits no edema or tenderness.  Lymphadenopathy:    He has no cervical adenopathy.  Neurological: He is alert and oriented to person, place, and time. He displays normal reflexes. He exhibits normal muscle tone.  Skin: Skin is warm and dry. No rash noted. He is not diaphoretic. No erythema. No pallor.  Psychiatric: He has a normal mood and affect. His behavior is normal. Judgment and thought content  normal.  Nursing note and vitals reviewed.  BP 108/68 (BP Location: Left Arm, Patient Position: Sitting, Cuff Size: Normal)   Pulse 71   Temp 98.1 F (36.7 C) (Oral)   Ht 5\' 3"  (1.6 m)   Wt 208 lb 2 oz (94.4 kg)   SpO2 98%    BMI 36.87 kg/m  Wt Readings from Last 3 Encounters:  03/19/17 208 lb 2 oz (94.4 kg)  03/16/16 194 lb 3.2 oz (88.1 kg)  03/12/15 181 lb 9.6 oz (82.4 kg)     Lab Results  Component Value Date   WBC 7.6 03/19/2017   HGB 15.8 03/19/2017   HCT 47.0 03/19/2017   PLT 286.0 03/19/2017   GLUCOSE 106 (H) 03/19/2017   CHOL 177 03/19/2017   TRIG 87.0 03/19/2017   HDL 37.20 (L) 03/19/2017   LDLCALC 122 (H) 03/19/2017   ALT 37 03/19/2017   AST 20 03/19/2017   NA 139 03/19/2017   K 4.0 03/19/2017   CL 107 03/19/2017   CREATININE 0.75 03/19/2017   BUN 12 03/19/2017   CO2 23 03/19/2017   TSH 4.06 03/19/2017    No results found.   Assessment & Plan:  Plan  I am having Mr. Valentina LucksGriffin maintain his fluticasone and cetirizine.  No orders of the defined types were placed in this encounter.   Problem List Items Addressed This Visit      Unprioritized   Preventative health care - Primary    ghm utd Check labs Pt understands how to do and states he does check regular testicular exams-- he preferred not to have GU exam today and is not having any problems       Relevant Orders   CBC with Differential/Platelet (Completed)   Lipid panel (Completed)   Comprehensive metabolic panel (Completed)   TSH (Completed)   POCT Urinalysis Dipstick (Automated) (Completed)    Other Visit Diagnoses    Need for Tdap vaccination       Relevant Orders   Tdap vaccine greater than or equal to 7yo IM (Completed)      Follow-up: Return in about 1 year (around 03/19/2018) for annual exam, fasting.  Donato SchultzYvonne R Lowne Chase, DO

## 2017-03-19 NOTE — Progress Notes (Signed)
Pre visit review using our clinic review tool, if applicable. No additional management support is needed unless otherwise documented below in the visit note. 

## 2018-01-14 ENCOUNTER — Encounter: Payer: Self-pay | Admitting: Family Medicine

## 2018-03-21 ENCOUNTER — Encounter: Payer: Self-pay | Admitting: Family Medicine

## 2018-03-21 ENCOUNTER — Encounter: Payer: 59 | Admitting: Family Medicine

## 2018-04-09 ENCOUNTER — Encounter: Payer: 59 | Admitting: Family Medicine

## 2018-11-11 ENCOUNTER — Encounter: Payer: Self-pay | Admitting: Family Medicine

## 2018-11-16 ENCOUNTER — Encounter (INDEPENDENT_AMBULATORY_CARE_PROVIDER_SITE_OTHER): Payer: Self-pay | Admitting: Ophthalmology

## 2018-11-16 ENCOUNTER — Ambulatory Visit (INDEPENDENT_AMBULATORY_CARE_PROVIDER_SITE_OTHER): Payer: 59 | Admitting: Ophthalmology

## 2018-11-16 DIAGNOSIS — H5000 Unspecified esotropia: Secondary | ICD-10-CM | POA: Diagnosis not present

## 2018-11-16 DIAGNOSIS — H53001 Unspecified amblyopia, right eye: Secondary | ICD-10-CM | POA: Diagnosis not present

## 2018-11-16 DIAGNOSIS — S0502XA Injury of conjunctiva and corneal abrasion without foreign body, left eye, initial encounter: Secondary | ICD-10-CM | POA: Diagnosis not present

## 2018-11-16 MED ORDER — OFLOXACIN 0.3 % OP SOLN: 1.0000 [drp] | Freq: Four times a day (QID) | OPHTHALMIC | 0 refills | Status: AC

## 2018-11-16 NOTE — Progress Notes (Signed)
[facility] - Clinic Note  11/16/2018     CHIEF COMPLAINT Patient presents for Eye Injury   HISTORY OF PRESENT ILLNESS: Alex Hamilton is a 38 y.o. male who presents to the clinic today for:   HPI    Eye Injury    In left eye.  Type of trauma is direct.  Duration of 3 hours.  Associated signs and symptoms include eye pain, blurred vision, redness and photophobia.  I, the attending physician,  performed the HPI with the patient and updated documentation appropriately.          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 Baseline VA OD ~20/30-20/40 per pt. Presents as urgent pt following scratch to left eye at ~2 pm today by 5 mo old infant son.       Last edited by Rennis ChrisZamora, Harrison Zetina, MD on 11/16/2018  5:16 PM. (History)      Referring physician: Zola ButtonLowne Chase, Grayling CongressYvonne R, DO [address] [place], [place] [id number]  HISTORICAL INFORMATION:   Selected notes from the MEDICAL RECORD NUMBER    CURRENT MEDICATIONS: Current Outpatient Medications (Ophthalmic Drugs)  Medication Sig  . ofloxacin (OCUFLOX) 0.3 % ophthalmic solution Place 1 drop into the left eye 4 (four) times daily for 30 days.   No current facility-administered medications for this visit.  (Ophthalmic Drugs)   Current Outpatient Medications (Other)  Medication Sig  . cetirizine (ZYRTEC) 10 MG tablet Take 10 mg by mouth daily.  . fluticasone (FLONASE) 50 MCG/ACT nasal spray Place into both nostrils daily.   No current facility-administered medications for this visit.  (Other)      REVIEW OF SYSTEMS: ROS    Positive for: Cardiovascular, Eyes   Negative for: Constitutional, Gastrointestinal, Neurological, Skin, Genitourinary, Musculoskeletal, HENT, Endocrine, Respiratory, Psychiatric, Allergic/Imm, Heme/Lymph   Last edited by Rennis ChrisZamora, Viviene Thurston, MD on 11/16/2018  4:18 PM. (History)       ALLERGIES Allergies   Allergen Reactions  . Ceclor [Cefaclor] Other (See Comments)    Unsure- mother told him of allergy   . Demerol [Meperidine] Other (See Comments)    Unsure- mother told him of allergy   . Naldecon Senior [Guaifenesin] Other (See Comments)    Unsure- mother told him of allergy    PAST MEDICAL HISTORY Past Medical History:  Diagnosis Date  . Allergy    seasonal  . Blood transfusion reaction   . Heart murmur    Past Surgical History:  Procedure Laterality Date  . EXPLORATION POST OPERATIVE OPEN HEART  1992    FAMILY HISTORY Family History  Problem Relation Age of Onset  . Brain cancer Father 3360  . Heart attack Father 2053       MI  . Lung cancer Father 6569  . Cancer Paternal Uncle        spine  . Cancer Paternal Uncle        brain  . Colon cancer Mother 5265  . Stroke Paternal Grandmother   . Stroke Maternal Grandmother   . Heart attack Maternal Grandfather   . Prostate cancer Maternal Grandfather     SOCIAL HISTORY Social History   Tobacco Use  . Smoking status: Never Smoker  . Smokeless tobacco: Never Used  Substance Use Topics  . Alcohol use: Yes    Alcohol/week: 0.0 standard drinks  . Drug use: No  OPHTHALMIC EXAM:  Base Eye Exam    Visual Acuity (Snellen - Linear)      Right Left   Dist [place] 20/50 +2 20/20   Dist ph [place] 20/25 +2        Pupils      Pupils Dark Light Shape React APD   Right PERRL 3 2 Round 2+ -   Left PERRL 3 2 Round 2+ -       Extraocular Movement      Right Left    Full Full  ET        Slit Lamp and Fundus Exam    Slit Lamp Exam      Right Left   Lids/Lashes Normal Normal   Conjunctiva/Sclera White and quiet mild injection   Cornea Clear 74mm x 3.5 mm corneal abrasion   Anterior Chamber Deep and quiet Deep and quiet   Iris Round and reactive Round and reactive   Lens Clear Clear   Vitreous Normal Normal       Fundus Exam      Right Left   Disc Normal Normal          IMAGING AND PROCEDURES  Imaging  and Procedures for 11/16/18           ASSESSMENT/PLAN:    ICD-10-CM   1. Abrasion of left cornea, initial encounter S05.02XA   2. Amblyopia, right eye H53.001   3. Esotropia H50.00     1. Corneal abrasion OS - 1 x 3.5 mm corneal abrasion in sup temp quadrant - mechanism of injury -- scratch by 5 mo old infant son - BCVA remains 20/20 OS - start ofloxacin gtts QID OS - f/u Monday at 930 for recheck and DFE/OCT  2. History of amblyopia OD - pt reports history of premature birth - history of patching - baseline vision 20/30-20/40 OD per pt -- stable today  3. Esotropia - long-standing, stable  Ophthalmic Meds Ordered this visit:  Meds ordered this encounter  Medications  . ofloxacin (OCUFLOX) 0.3 % ophthalmic solution    Sig: Place 1 drop into the left eye 4 (four) times daily for 30 days.    Dispense:  5 mL    Refill:  0       Return in about 2 days (around 11/18/2018) for Call f/u - corneal abrasion OS; , Dilated Exam, OCT.  There are no Patient Instructions on file for this visit.   Explained the diagnoses, plan, and follow up with the patient and they expressed understanding.  Patient expressed understanding of the importance of proper follow up care.   Karie Chimera, M.D., Ph.D. Diseases & Surgery of the Retina and Vitreous [facility] 11/16/18     Abbreviations: M myopia (nearsighted); A astigmatism; H hyperopia (farsighted); P presbyopia; Mrx spectacle prescription;  CTL contact lenses; OD right eye; OS left eye; OU both eyes  XT exotropia; ET esotropia; PEK punctate epithelial keratitis; PEE punctate epithelial erosions; DES dry eye syndrome; MGD meibomian gland dysfunction; ATs artificial tears; PFAT's preservative free artificial tears; NSC nuclear sclerotic cataract; PSC posterior subcapsular cataract; ERM epi-retinal membrane; PVD posterior vitreous detachment; RD retinal detachment; DM diabetes mellitus; DR diabetic  retinopathy; NPDR non-proliferative diabetic retinopathy; PDR proliferative diabetic retinopathy; CSME clinically significant macular edema; DME diabetic macular edema; dbh dot blot hemorrhages; CWS cotton wool spot; POAG primary open angle glaucoma; C/D cup-to-disc ratio; HVF humphrey visual field; GVF goldmann visual field; OCT optical coherence tomography; IOP intraocular  pressure; BRVO Branch retinal vein occlusion; CRVO central retinal vein occlusion; CRAO central retinal artery occlusion; BRAO branch retinal artery occlusion; RT retinal tear; SB scleral buckle; PPV pars plana vitrectomy; VH Vitreous hemorrhage; PRP panretinal laser photocoagulation; IVK intravitreal kenalog; VMT vitreomacular traction; MH Macular hole;  NVD neovascularization of the disc; NVE neovascularization elsewhere; AREDS age related eye disease study; ARMD age related macular degeneration; POAG primary open angle glaucoma; EBMD epithelial/anterior basement membrane dystrophy; ACIOL anterior chamber intraocular lens; IOL intraocular lens; PCIOL posterior chamber intraocular lens; Phaco/IOL phacoemulsification with intraocular lens placement; [place] photorefractive keratectomy; LASIK laser assisted in situ keratomileusis; HTN hypertension; DM diabetes mellitus; COPD chronic obstructive pulmonary disease

## 2018-11-17 NOTE — Progress Notes (Signed)
[facility] - Clinic Note  11/18/2018     CHIEF COMPLAINT Patient presents for Eye Problem (Call f/u: Corneal abrasion OS)   HISTORY OF PRESENT ILLNESS: Alex DistanceMatthew Hamilton is a 38 y.o. male who presents to the clinic today for:   HPI    Eye Problem     Additional comments: Call f/u: Corneal abrasion OS          Comments    38 y/o male pt here for 2 day f/u for corneal abrasion OS.  No change in [place] OU.  Left eye feeling much better as of yesterday afternoon.  Denies pain, flashes, floaters.  Ofloxacin QID OS.   fa       Last edited by Rennis ChrisZamora, Jaxston Chohan, MD on 11/18/2018 12:05 PM. (History)    pt states that around 430-500 yesterday afternoon, he felt like his eye was "back to normal"  Referring physician: Zola ButtonLowne Chase, Grayling CongressYvonne R, DO [address] [place], [place] [id number]  HISTORICAL INFORMATION:   Selected notes from the MEDICAL RECORD NUMBER    CURRENT MEDICATIONS: Current Outpatient Medications (Ophthalmic Drugs)  Medication Sig  . ofloxacin (OCUFLOX) 0.3 % ophthalmic solution Place 1 drop into the left eye 4 (four) times daily for 30 days.   No current facility-administered medications for this visit.  (Ophthalmic Drugs)   Current Outpatient Medications (Other)  Medication Sig  . cetirizine (ZYRTEC) 10 MG tablet Take 10 mg by mouth daily.  . fluticasone (FLONASE) 50 MCG/ACT nasal spray Place into both nostrils daily.   No current facility-administered medications for this visit.  (Other)      REVIEW OF SYSTEMS: ROS    Positive for: Eyes   Negative for: Constitutional, Gastrointestinal, Neurological, Skin, Genitourinary, Musculoskeletal, HENT, Endocrine, Cardiovascular, Respiratory, Psychiatric, Allergic/Imm, Heme/Lymph   Last edited by Celine MansBaxley, Andrew G, COA on 11/18/2018  9:43 AM. (History)       ALLERGIES Allergies  Allergen Reactions  . Ceclor [Cefaclor] Other (See Comments)    Unsure- mother told him of allergy   . Demerol  [Meperidine] Other (See Comments)    Unsure- mother told him of allergy   . Naldecon Senior [Guaifenesin] Other (See Comments)    Unsure- mother told him of allergy    PAST MEDICAL HISTORY Past Medical History:  Diagnosis Date  . Allergy    seasonal  . Blood transfusion reaction   . Heart murmur    Past Surgical History:  Procedure Laterality Date  . EXPLORATION POST OPERATIVE OPEN HEART  1992    FAMILY HISTORY Family History  Problem Relation Age of Onset  . Brain cancer Father 5860  . Heart attack Father 3853       MI  . Lung cancer Father 3269  . Cancer Paternal Uncle        spine  . Cancer Paternal Uncle        brain  . Colon cancer Mother 7665  . Stroke Paternal Grandmother   . Stroke Maternal Grandmother   . Heart attack Maternal Grandfather   . Prostate cancer Maternal Grandfather     SOCIAL HISTORY Social History   Tobacco Use  . Smoking status: Never Smoker  . Smokeless tobacco: Never Used  Substance Use Topics  . Alcohol use: Yes    Alcohol/week: 0.0 standard drinks  . Drug use: No         OPHTHALMIC EXAM:  Base Eye Exam    Visual Acuity (Snellen - Linear)  Right Left   Dist [place] 20/40 -2 20/20 -   Dist ph [place] 20/30 +2        Tonometry (Tonopen, 9:46 AM)      Right Left   Pressure 15   Defer OS due to corneal abrasion       Pupils      Dark Light Shape React APD   Right 3 2 Round Brisk None   Left 3 2 Round Brisk None       Visual Fields (Counting fingers)      Left Right    Full Full       Extraocular Movement      Right Left    Full Full  ET       Neuro/Psych    Oriented x3:  Yes   Mood/Affect:  Normal       Dilation    Both eyes:  1.0% Mydriacyl, 2.5% Phenylephrine @ 9:46 AM        Slit Lamp and Fundus Exam    Slit Lamp Exam      Right Left   Lids/Lashes Normal Normal   Conjunctiva/Sclera White and quiet mild injection   Cornea Clear 1mm x 3.5 mm corneal abrasion - closed, mild punctate staining ST quadrant    Anterior Chamber Deep and quiet Deep and quiet   Iris Round and reactive Round and reactive   Lens Clear Clear   Vitreous Normal Normal       Fundus Exam      Right Left   Disc Tilted disc, Pink and Sharp mildly Tilted disc, Pink and Sharp   C/D Ratio 0.55 0.5   Macula Flat, Good foveal reflex, No heme or edema Flat, Good foveal reflex, No heme or edema   Vessels Normal Normal   Periphery Attached    Attached             IMAGING AND PROCEDURES  Imaging and Procedures for 11/16/18  OCT, Retina - OU - Both Eyes       Right Eye Quality was good. Central Foveal Thickness: 273. Progression has no prior data. Findings include normal foveal contour, no SRF, no IRF.   Left Eye Quality was good. Central Foveal Thickness: 280. Progression has no prior data. Findings include normal foveal contour, no SRF, no IRF.   Notes *Images captured and stored on drive  Diagnosis / Impression:  NFP, no IRF/SRF   Clinical management:  See below  Abbreviations: NFP - Normal foveal profile. CME - cystoid macular edema. PED - pigment epithelial detachment. IRF - intraretinal fluid. SRF - subretinal fluid. EZ - ellipsoid zone. ERM - epiretinal membrane. ORA - outer retinal atrophy. ORT - outer retinal tubulation. SRHM - subretinal hyper-reflective material                 ASSESSMENT/PLAN:    ICD-10-CM   1. Abrasion of left cornea, subsequent encounter S05.02XD   2. Amblyopia, right eye H53.001   3. Esotropia H50.00   4. Retinal edema H35.81 OCT, Retina - OU - Both Eyes    1. Corneal abrasion OS -- resolved - initially presented on 4.11.2020 w/ 1 x 3.5 mm corneal abrasion in sup temp quadrant - mechanism of injury -- scratch by 5 mo old infant son - corneal epi defect now closed -- doing great and feeling better - BCVA 20/20 OS - continue ofloxacin gtts QID OS x5 more days - f/u PRN  2. History of amblyopia OD - pt reports  history of premature birth - history of patching -  baseline vision 20/30-20/40 OD per pt -- stable today  3. Esotropia - long-standing, stable  4. No retinal edema on exam or OCT     Return if symptoms worsen or fail to improve.  There are no Patient Instructions on file for this visit.    This document serves as a record of services personally performed by Karie Chimera, MD, PhD. It was created on their behalf by Laurian Brim, OA, an ophthalmic assistant. The creation of this record is the provider's dictation and/or activities during the visit.    Electronically signed by: Laurian Brim, OA  04.13.2020 12:05 PM    Karie Chimera, M.D., Ph.D. Diseases & Surgery of the Retina and Vitreous [facility]   I have reviewed the above documentation for accuracy and completeness, and I agree with the above. Karie Chimera, M.D., Ph.D. 11/18/18 12:07 PM     Abbreviations: M myopia (nearsighted); A astigmatism; H hyperopia (farsighted); P presbyopia; Mrx spectacle prescription;  CTL contact lenses; OD right eye; OS left eye; OU both eyes  XT exotropia; ET esotropia; PEK punctate epithelial keratitis; PEE punctate epithelial erosions; DES dry eye syndrome; MGD meibomian gland dysfunction; ATs artificial tears; PFAT's preservative free artificial tears; NSC nuclear sclerotic cataract; PSC posterior subcapsular cataract; ERM epi-retinal membrane; PVD posterior vitreous detachment; RD retinal detachment; DM diabetes mellitus; DR diabetic retinopathy; NPDR non-proliferative diabetic retinopathy; PDR proliferative diabetic retinopathy; CSME clinically significant macular edema; DME diabetic macular edema; dbh dot blot hemorrhages; CWS cotton wool spot; POAG primary open angle glaucoma; C/D cup-to-disc ratio; HVF humphrey visual field; GVF goldmann visual field; OCT optical coherence tomography; IOP intraocular pressure; BRVO Branch retinal vein occlusion; CRVO central retinal vein occlusion; CRAO central retinal artery  occlusion; BRAO branch retinal artery occlusion; RT retinal tear; SB scleral buckle; PPV pars plana vitrectomy; VH Vitreous hemorrhage; PRP panretinal laser photocoagulation; IVK intravitreal kenalog; VMT vitreomacular traction; MH Macular hole;  NVD neovascularization of the disc; NVE neovascularization elsewhere; AREDS age related eye disease study; ARMD age related macular degeneration; POAG primary open angle glaucoma; EBMD epithelial/anterior basement membrane dystrophy; ACIOL anterior chamber intraocular lens; IOL intraocular lens; PCIOL posterior chamber intraocular lens; Phaco/IOL phacoemulsification with intraocular lens placement; PRK photorefractive keratectomy; LASIK laser assisted in situ keratomileusis; HTN hypertension; DM diabetes mellitus; COPD chronic obstructive pulmonary disease

## 2018-11-18 ENCOUNTER — Ambulatory Visit (INDEPENDENT_AMBULATORY_CARE_PROVIDER_SITE_OTHER): Payer: 59 | Admitting: Ophthalmology

## 2018-11-18 ENCOUNTER — Encounter (INDEPENDENT_AMBULATORY_CARE_PROVIDER_SITE_OTHER): Payer: Self-pay | Admitting: Ophthalmology

## 2018-11-18 ENCOUNTER — Other Ambulatory Visit: Payer: Self-pay

## 2018-11-18 DIAGNOSIS — H3581 Retinal edema: Secondary | ICD-10-CM | POA: Diagnosis not present

## 2018-11-18 DIAGNOSIS — H5000 Unspecified esotropia: Secondary | ICD-10-CM

## 2018-11-18 DIAGNOSIS — H53001 Unspecified amblyopia, right eye: Secondary | ICD-10-CM

## 2018-11-18 DIAGNOSIS — S0502XD Injury of conjunctiva and corneal abrasion without foreign body, left eye, subsequent encounter: Secondary | ICD-10-CM

## 2019-01-20 ENCOUNTER — Ambulatory Visit (INDEPENDENT_AMBULATORY_CARE_PROVIDER_SITE_OTHER): Payer: 59 | Admitting: Family Medicine

## 2019-01-20 ENCOUNTER — Other Ambulatory Visit: Payer: Self-pay

## 2019-01-20 ENCOUNTER — Encounter: Payer: Self-pay | Admitting: Family Medicine

## 2019-01-20 VITALS — BP 119/79 | HR 57 | Temp 98.3°F | Resp 18 | Ht 63.0 in | Wt 181.6 lb

## 2019-01-20 DIAGNOSIS — Z0000 Encounter for general adult medical examination without abnormal findings: Secondary | ICD-10-CM

## 2019-01-20 NOTE — Progress Notes (Signed)
Patient ID: Alex Hamilton Jurgens, male    DOB: 01/30/1981  Age: 38 y.o. MRN: [id number]    Subjective:  Subjective  HPI Alex Hamilton Grassia presents for cpe  No complaints    Review of Systems  Constitutional: Negative for chills, fatigue, fever and unexpected weight change.  HENT: Negative for congestion and hearing loss.   Eyes: Negative for discharge.  Respiratory: Negative for cough and shortness of breath.   Cardiovascular: Negative for chest pain, palpitations and leg swelling.  Gastrointestinal: Negative for abdominal pain, blood in stool, constipation, diarrhea, nausea and vomiting.  Genitourinary: Negative for dysuria, frequency, hematuria and urgency.  Musculoskeletal: Negative for back pain and myalgias.  Skin: Negative for rash.  Allergic/Immunologic: Negative for environmental allergies.  Neurological: Negative for dizziness, weakness and headaches.  Hematological: Does not bruise/bleed easily.  Psychiatric/Behavioral: Negative for suicidal ideas. The patient is not nervous/anxious.     History Past Medical History:  Diagnosis Date  . Allergy    seasonal  . Blood transfusion reaction   . Heart murmur     He has a past surgical history that includes Exploration post operative open heart (1992).   His family history includes Brain cancer (age of onset: 2460) in his father; Cancer in his paternal uncle and paternal uncle; Colon cancer (age of onset: 8065) in his mother; Heart attack in his maternal grandfather; Heart attack (age of onset: 8253) in his father; Lung cancer (age of onset: 969) in his father; Prostate cancer in his maternal grandfather; Stroke in his maternal grandmother and paternal grandmother.He reports that he has never smoked. He has never used smokeless tobacco. He reports current alcohol use. He reports that he does not use drugs.  Current Outpatient Medications on File Prior to Visit  Medication Sig Dispense Refill  . cetirizine (ZYRTEC) 10 MG tablet Take 10 mg by  mouth daily.    . fluticasone (FLONASE) 50 MCG/ACT nasal spray Place into both nostrils daily.     No current facility-administered medications on file prior to visit.      Objective:  Objective  Physical Exam Vitals signs and nursing note reviewed.  Constitutional:      General: He is sleeping. He is not in acute distress.    Appearance: He is well-developed. He is not diaphoretic.  HENT:     Head: Normocephalic and atraumatic.     Right Ear: External ear normal.     Left Ear: External ear normal.     Nose: Nose normal.     Mouth/Throat:     Pharynx: No oropharyngeal exudate.  Eyes:     General:        Right eye: No discharge.        Left eye: No discharge.     Conjunctiva/sclera: Conjunctivae normal.     Pupils: Pupils are equal, round, and reactive to light.  Neck:     Musculoskeletal: Normal range of motion and neck supple.     Thyroid: No thyromegaly.     Vascular: No JVD.  Cardiovascular:     Rate and Rhythm: Normal rate and regular rhythm.     Heart sounds: Normal heart sounds. No murmur. No friction rub. No gallop.   Pulmonary:     Effort: Pulmonary effort is normal. No respiratory distress.     Breath sounds: Normal breath sounds. No wheezing or rales.  Chest:     Chest wall: No tenderness.  Abdominal:     General: Bowel sounds are normal. There is no distension.  Palpations: Abdomen is soft. There is no mass.     Tenderness: There is no abdominal tenderness. There is no guarding or rebound.  Genitourinary:    Comments: Pt refused Musculoskeletal: Normal range of motion.        General: No tenderness.  Lymphadenopathy:     Cervical: No cervical adenopathy.  Skin:    General: Skin is warm and dry.     Coloration: Skin is not pale.     Findings: No erythema or rash.  Neurological:     Mental Status: He is oriented to person, place, and time.     Motor: No abnormal muscle tone.     Deep Tendon Reflexes: Reflexes are normal and symmetric. Reflexes  normal.  Psychiatric:        Behavior: Behavior normal.        Thought Content: Thought content normal.        Judgment: Judgment normal.    BP 119/79 (BP Location: Right Arm, Patient Position: Sitting, Cuff Size: Normal)   Pulse (!) 57   Temp 98.3 F (36.8 C) (Oral)   Resp 18   Ht 5\' 3"  (1.6 m)   Wt 181 lb 9.6 oz (82.4 kg)   SpO2 98%   BMI 32.17 kg/m  Wt Readings from Last 3 Encounters:  01/20/19 181 lb 9.6 oz (82.4 kg)  03/19/17 208 lb 2 oz (94.4 kg)  03/16/16 194 lb 3.2 oz (88.1 kg)     Lab Results  Component Value Date   WBC 7.6 03/19/2017   HGB 15.8 03/19/2017   HCT 47.0 03/19/2017   PLT 286.0 03/19/2017   GLUCOSE 106 (H) 03/19/2017   CHOL 177 03/19/2017   TRIG 87.0 03/19/2017   HDL 37.20 (L) 03/19/2017   LDLCALC 122 (H) 03/19/2017   ALT 37 03/19/2017   AST 20 03/19/2017   NA 139 03/19/2017   K 4.0 03/19/2017   CL 107 03/19/2017   CREATININE 0.75 03/19/2017   BUN 12 03/19/2017   CO2 23 03/19/2017   TSH 4.06 03/19/2017    No results found.   Assessment & Plan:  Plan  I am having Onofrio Klemp maintain his fluticasone and cetirizine.  No orders of the defined types were placed in this encounter.   Problem List Items Addressed This Visit      Unprioritized   Preventative health care - Primary   Relevant Orders   CBC with Differential/Platelet   Lipid panel   TSH   Comprehensive metabolic panel    ghm utd Check labs See AVS  Follow-up: Return in about 1 year (around 01/20/2020), or if symptoms worsen or fail to improve, for annual exam, fasting.  Ann Held, DO

## 2019-01-20 NOTE — Patient Instructions (Signed)
Preventive Care 18-39 Years, Male Preventive care refers to lifestyle choices and visits with your health care provider that can promote health and wellness. What does preventive care include?   A yearly physical exam. This is also called an annual well check.  Dental exams once or twice a year.  Routine eye exams. Ask your health care provider how often you should have your eyes checked.  Personal lifestyle choices, including: ? Daily care of your teeth and gums. ? Regular physical activity. ? Eating a healthy diet. ? Avoiding tobacco and drug use. ? Limiting alcohol use. ? Practicing safe sex. What happens during an annual well check? The services and screenings done by your health care provider during your annual well check will depend on your age, overall health, lifestyle risk factors, and family history of disease. Counseling Your health care provider may ask you questions about your:  Alcohol use.  Tobacco use.  Drug use.  Emotional well-being.  Home and relationship well-being.  Sexual activity.  Eating habits.  Work and work environment. Screening You may have the following tests or measurements:  Height, weight, and BMI.  Blood pressure.  Lipid and cholesterol levels. These may be checked every 5 years starting at age 20.  Diabetes screening. This is done by checking your blood sugar (glucose) after you have not eaten for a while (fasting).  Skin check.  Hepatitis C blood test.  Hepatitis B blood test.  Sexually transmitted disease (STD) testing. Discuss your test results, treatment options, and if necessary, the need for more tests with your health care provider. Vaccines Your health care provider may recommend certain vaccines, such as:  Influenza vaccine. This is recommended every year.  Tetanus, diphtheria, and acellular pertussis (Tdap, Td) vaccine. You may need a Td booster every 10 years.  Varicella vaccine. You may need this if you  have not been vaccinated.  HPV vaccine. If you are 26 or younger, you may need three doses over 6 months.  Measles, mumps, and rubella (MMR) vaccine. You may need at least one dose of MMR.You may also need a second dose.  Pneumococcal 13-valent conjugate (PCV13) vaccine. You may need this if you have certain conditions and have not been vaccinated.  Pneumococcal polysaccharide (PPSV23) vaccine. You may need one or two doses if you smoke cigarettes or if you have certain conditions.  Meningococcal vaccine. One dose is recommended if you are age 19-21 years and a first-year college student living in a residence hall, or if you have one of several medical conditions. You may also need additional booster doses.  Hepatitis A vaccine. You may need this if you have certain conditions or if you travel or work in places where you may be exposed to hepatitis A.  Hepatitis B vaccine. You may need this if you have certain conditions or if you travel or work in places where you may be exposed to hepatitis B.  Haemophilus influenzae type b (Hib) vaccine. You may need this if you have certain risk factors. Talk to your health care provider about which screenings and vaccines you need and how often you need them. This information is not intended to replace advice given to you by your health care provider. Make sure you discuss any questions you have with your health care provider. Document Released: 09/19/2001 Document Revised: 03/06/2017 Document Reviewed: 05/25/2015 Elsevier Interactive Patient Education  2019 Elsevier Inc.  

## 2019-01-21 LAB — TSH: TSH: 2.92 u[IU]/mL (ref 0.35–4.50)

## 2019-01-21 LAB — CBC WITH DIFFERENTIAL/PLATELET
Basophils Absolute: 0 10*3/uL (ref 0.0–0.1)
Basophils Relative: 0.6 % (ref 0.0–3.0)
Eosinophils Absolute: 0.1 10*3/uL (ref 0.0–0.7)
Eosinophils Relative: 1.6 % (ref 0.0–5.0)
HCT: 47 % (ref 39.0–52.0)
Hemoglobin: 15.8 g/dL (ref 13.0–17.0)
Lymphocytes Relative: 21.3 % (ref 12.0–46.0)
Lymphs Abs: 1.6 10*3/uL (ref 0.7–4.0)
MCHC: 33.6 g/dL (ref 30.0–36.0)
MCV: 87 fl (ref 78.0–100.0)
Monocytes Absolute: 0.8 10*3/uL (ref 0.1–1.0)
Monocytes Relative: 11 % (ref 3.0–12.0)
Neutro Abs: 4.8 10*3/uL (ref 1.4–7.7)
Neutrophils Relative %: 65.5 % (ref 43.0–77.0)
Platelets: 292 10*3/uL (ref 150.0–400.0)
RBC: 5.41 Mil/uL (ref 4.22–5.81)
RDW: 12.9 % (ref 11.5–15.5)
WBC: 7.3 10*3/uL (ref 4.0–10.5)

## 2019-01-21 LAB — COMPREHENSIVE METABOLIC PANEL
ALT: 30 U/L (ref 0–53)
AST: 20 U/L (ref 0–37)
Albumin: 4.8 g/dL (ref 3.5–5.2)
Alkaline Phosphatase: 91 U/L (ref 39–117)
BUN: 12 mg/dL (ref 6–23)
CO2: 28 mEq/L (ref 19–32)
Calcium: 9.9 mg/dL (ref 8.4–10.5)
Chloride: 103 mEq/L (ref 96–112)
Creatinine, Ser: 0.82 mg/dL (ref 0.40–1.50)
GFR: 105.58 mL/min (ref >60.00)
Glucose, Bld: 84 mg/dL (ref 70–99)
Potassium: 4.3 mEq/L (ref 3.5–5.1)
Sodium: 140 mEq/L (ref 135–145)
Total Bilirubin: 0.6 mg/dL (ref 0.2–1.2)
Total Protein: 7.1 g/dL (ref 6.0–8.3)

## 2019-01-21 LAB — LIPID PANEL
Cholesterol: 180 mg/dL (ref 0–200)
HDL: 30.7 mg/dL — ABNORMAL LOW (ref >39.00)
LDL Cholesterol: 127 mg/dL — ABNORMAL HIGH (ref 0–99)
NonHDL: 149.33
Total CHOL/HDL Ratio: 6
Triglycerides: 112 mg/dL (ref 0.0–149.0)
VLDL: 22.4 mg/dL (ref 0.0–40.0)

## 2019-01-27 ENCOUNTER — Other Ambulatory Visit: Payer: Self-pay | Admitting: Family Medicine

## 2019-01-27 DIAGNOSIS — E785 Hyperlipidemia, unspecified: Secondary | ICD-10-CM

## 2020-01-23 ENCOUNTER — Encounter: Payer: 59 | Admitting: Family Medicine

## 2020-03-15 ENCOUNTER — Ambulatory Visit (INDEPENDENT_AMBULATORY_CARE_PROVIDER_SITE_OTHER): Payer: 59 | Admitting: Family Medicine

## 2020-03-15 ENCOUNTER — Encounter: Payer: Self-pay | Admitting: Family Medicine

## 2020-03-15 ENCOUNTER — Other Ambulatory Visit: Payer: Self-pay

## 2020-03-15 VITALS — BP 110/80 | HR 65 | Temp 97.6°F | Resp 18 | Ht 63.0 in | Wt 181.0 lb

## 2020-03-15 DIAGNOSIS — Z0000 Encounter for general adult medical examination without abnormal findings: Secondary | ICD-10-CM | POA: Diagnosis not present

## 2020-03-15 DIAGNOSIS — Z1159 Encounter for screening for other viral diseases: Secondary | ICD-10-CM | POA: Diagnosis not present

## 2020-03-15 LAB — COMPREHENSIVE METABOLIC PANEL
ALT: 25 U/L (ref 0–53)
AST: 18 U/L (ref 0–37)
Albumin: 4.6 g/dL (ref 3.5–5.2)
Alkaline Phosphatase: 68 U/L (ref 39–117)
BUN: 11 mg/dL (ref 6–23)
CO2: 26 mEq/L (ref 19–32)
Calcium: 9.7 mg/dL (ref 8.4–10.5)
Chloride: 103 mEq/L (ref 96–112)
Creatinine, Ser: 0.87 mg/dL (ref 0.40–1.50)
GFR: 98 mL/min (ref >60.00)
Glucose, Bld: 87 mg/dL (ref 70–99)
Potassium: 4 mEq/L (ref 3.5–5.1)
Sodium: 139 mEq/L (ref 135–145)
Total Bilirubin: 0.6 mg/dL (ref 0.2–1.2)
Total Protein: 7.5 g/dL (ref 6.0–8.3)

## 2020-03-15 LAB — CBC WITH DIFFERENTIAL/PLATELET
Basophils Absolute: 0 10*3/uL (ref 0.0–0.1)
Basophils Relative: 0.7 % (ref 0.0–3.0)
Eosinophils Absolute: 0.2 10*3/uL (ref 0.0–0.7)
Eosinophils Relative: 3.1 % (ref 0.0–5.0)
HCT: 45.3 % (ref 39.0–52.0)
Hemoglobin: 15.5 g/dL (ref 13.0–17.0)
Lymphocytes Relative: 23.6 % (ref 12.0–46.0)
Lymphs Abs: 1.6 10*3/uL (ref 0.7–4.0)
MCHC: 34.3 g/dL (ref 30.0–36.0)
MCV: 88.1 fl (ref 78.0–100.0)
Monocytes Absolute: 0.8 10*3/uL (ref 0.1–1.0)
Monocytes Relative: 11.3 % (ref 3.0–12.0)
Neutro Abs: 4.2 10*3/uL (ref 1.4–7.7)
Neutrophils Relative %: 61.3 % (ref 43.0–77.0)
Platelets: 273 10*3/uL (ref 150.0–400.0)
RBC: 5.14 Mil/uL (ref 4.22–5.81)
RDW: 12.9 % (ref 11.5–15.5)
WBC: 6.9 10*3/uL (ref 4.0–10.5)

## 2020-03-15 LAB — LIPID PANEL
Cholesterol: 193 mg/dL (ref 0–200)
HDL: 41.6 mg/dL (ref >39.00)
LDL Cholesterol: 125 mg/dL — ABNORMAL HIGH (ref 0–99)
NonHDL: 151.87
Total CHOL/HDL Ratio: 5
Triglycerides: 136 mg/dL (ref 0.0–149.0)
VLDL: 27.2 mg/dL (ref 0.0–40.0)

## 2020-03-15 LAB — TSH: TSH: 2.79 u[IU]/mL (ref 0.35–4.50)

## 2020-03-15 NOTE — Progress Notes (Signed)
Patient ID: Alex Hamilton, male    DOB: 10/09/1980  Age: 39 y.o. MRN: [id number]    Subjective:  Subjective  HPI Alex Hamilton presents for cpe   No complaints  Review of Systems  Constitutional: Negative for appetite change, diaphoresis, fatigue and unexpected weight change.  Eyes: Negative for pain, redness and visual disturbance.  Respiratory: Negative for cough, chest tightness, shortness of breath and wheezing.   Cardiovascular: Negative for chest pain, palpitations and leg swelling.  Endocrine: Negative for cold intolerance, heat intolerance, polydipsia, polyphagia and polyuria.  Genitourinary: Negative for difficulty urinating, dysuria and frequency.  Neurological: Negative for dizziness, light-headedness, numbness and headaches.    History Past Medical History:  Diagnosis Date  . Allergy    seasonal  . Blood transfusion reaction   . Heart murmur     He has a past surgical history that includes Exploration post operative open heart (1992).   His family history includes Brain cancer (age of onset: 67) in his father; Cancer in his paternal uncle and paternal uncle; Colon cancer (age of onset: 63) in his mother; Heart attack in his maternal grandfather; Heart attack (age of onset: 39) in his father; Lung cancer (age of onset: 48) in his father; Prostate cancer in his maternal grandfather; Stroke in his maternal grandmother and paternal grandmother.He reports that he has never smoked. He has never used smokeless tobacco. He reports current alcohol use. He reports that he does not use drugs.  Current Outpatient Medications on File Prior to Visit  Medication Sig Dispense Refill  . cetirizine (ZYRTEC) 10 MG tablet Take 10 mg by mouth daily.    . fluticasone (FLONASE) 50 MCG/ACT nasal spray Place into both nostrils daily.     No current facility-administered medications on file prior to visit.     Objective:  Objective  Physical Exam Vitals and nursing note reviewed.   Constitutional:      General: He is not in acute distress.    Appearance: He is well-developed. He is not diaphoretic.  HENT:     Head: Normocephalic and atraumatic.     Right Ear: External ear normal.     Left Ear: External ear normal.     Nose: Nose normal.     Mouth/Throat:     Pharynx: No oropharyngeal exudate.  Eyes:     General:        Right eye: No discharge.        Left eye: No discharge.     Conjunctiva/sclera: Conjunctivae normal.     Pupils: Pupils are equal, round, and reactive to light.  Neck:     Thyroid: No thyromegaly.     Vascular: No JVD.  Cardiovascular:     Rate and Rhythm: Normal rate and regular rhythm.     Heart sounds: No murmur heard.   Pulmonary:     Effort: Pulmonary effort is normal. No respiratory distress.     Breath sounds: Normal breath sounds. No wheezing or rales.  Chest:     Chest wall: No tenderness.  Abdominal:     General: Bowel sounds are normal. There is no distension.     Palpations: Abdomen is soft. There is no mass.     Tenderness: There is no abdominal tenderness. There is no guarding or rebound.  Musculoskeletal:        General: No tenderness. Normal range of motion.     Cervical back: Normal range of motion and neck supple.  Lymphadenopathy:     Cervical:  No cervical adenopathy.  Skin:    General: Skin is warm and dry.     Findings: No erythema or rash.  Neurological:     Mental Status: He is alert and oriented to person, place, and time.     Cranial Nerves: No cranial nerve deficit.     Motor: No abnormal muscle tone.     Deep Tendon Reflexes: Reflexes are normal and symmetric. Reflexes normal.  Psychiatric:        Behavior: Behavior normal.        Thought Content: Thought content normal.        Judgment: Judgment normal.    Health Maintenance  Topic Date Due  . Hepatitis C Screening  Never done  . COVID-19 Vaccine (1) Never done  . INFLUENZA VACCINE  03/07/2020  . TETANUS/TDAP  03/20/2027  . HIV Screening   Completed   BP 110/80 (BP Location: Right Arm, Patient Position: Sitting, Cuff Size: Normal)   Pulse 65   Temp 97.6 F (36.4 C) (Oral)   Resp 18   Ht 5\' 3"  (1.6 m)   Wt 181 lb (82.1 kg)   SpO2 99%   BMI 32.06 kg/m  Wt Readings from Last 3 Encounters:  03/15/20 181 lb (82.1 kg)  01/20/19 181 lb 9.6 oz (82.4 kg)  03/19/17 208 lb 2 oz (94.4 kg)     Lab Results  Component Value Date   WBC 7.3 01/20/2019   HGB 15.8 01/20/2019   HCT 47.0 01/20/2019   PLT 292.0 01/20/2019   GLUCOSE 84 01/20/2019   CHOL 180 01/20/2019   TRIG 112.0 01/20/2019   HDL 30.70 (L) 01/20/2019   LDLCALC 127 (H) 01/20/2019   ALT 30 01/20/2019   AST 20 01/20/2019   NA 140 01/20/2019   K 4.3 01/20/2019   CL 103 01/20/2019   CREATININE 0.82 01/20/2019   BUN 12 01/20/2019   CO2 28 01/20/2019   TSH 2.92 01/20/2019    No results found.   Assessment & Plan:  Plan  I am having Alex Hamilton maintain his fluticasone and cetirizine.  No orders of the defined types were placed in this encounter.   Problem List Items Addressed This Visit      Unprioritized   Preventative health care - Primary    ghm utd Check labs  See AVS      Relevant Orders   CBC with Differential/Platelet   Lipid panel   TSH   Comprehensive metabolic panel    Other Visit Diagnoses    Need for hepatitis C screening test       Relevant Orders   Hepatitis C antibody      Follow-up: Return in about 1 year (around 03/15/2021), or if symptoms worsen or fail to improve, for annual exam, fasting.  05/15/2021, DO

## 2020-03-15 NOTE — Assessment & Plan Note (Signed)
ghm utd Check labs See AVS 

## 2020-03-15 NOTE — Patient Instructions (Signed)
Preventive Care 19-38 Years Old, Male Preventive care refers to lifestyle choices and visits with your health care provider that can promote health and wellness. This includes:  A yearly physical exam. This is also called an annual well check.  Regular dental and eye exams.  Immunizations.  Screening for certain conditions.  Healthy lifestyle choices, such as eating a healthy diet, getting regular exercise, not using drugs or products that contain nicotine and tobacco, and limiting alcohol use. What can I expect for my preventive care visit? Physical exam Your health care provider will check:  Height and weight. These may be used to calculate body mass index (BMI), which is a measurement that tells if you are at a healthy weight.  Heart rate and blood pressure.  Your skin for abnormal spots. Counseling Your health care provider may ask you questions about:  Alcohol, tobacco, and drug use.  Emotional well-being.  Home and relationship well-being.  Sexual activity.  Eating habits.  Work and work Statistician. What immunizations do I need?  Influenza (flu) vaccine  This is recommended every year. Tetanus, diphtheria, and pertussis (Tdap) vaccine  You may need a Td booster every 10 years. Varicella (chickenpox) vaccine  You may need this vaccine if you have not already been vaccinated. Human papillomavirus (HPV) vaccine  If recommended by your health care provider, you may need three doses over 6 months. Measles, mumps, and rubella (MMR) vaccine  You may need at least one dose of MMR. You may also need a second dose. Meningococcal conjugate (MenACWY) vaccine  One dose is recommended if you are 45-76 years old and a Market researcher living in a residence hall, or if you have one of several medical conditions. You may also need additional booster doses. Pneumococcal conjugate (PCV13) vaccine  You may need this if you have certain conditions and were not  previously vaccinated. Pneumococcal polysaccharide (PPSV23) vaccine  You may need one or two doses if you smoke cigarettes or if you have certain conditions. Hepatitis A vaccine  You may need this if you have certain conditions or if you travel or work in places where you may be exposed to hepatitis A. Hepatitis B vaccine  You may need this if you have certain conditions or if you travel or work in places where you may be exposed to hepatitis B. Haemophilus influenzae type b (Hib) vaccine  You may need this if you have certain risk factors. You may receive vaccines as individual doses or as more than one vaccine together in one shot (combination vaccines). Talk with your health care provider about the risks and benefits of combination vaccines. What tests do I need? Blood tests  Lipid and cholesterol levels. These may be checked every 5 years starting at age 17.  Hepatitis C test.  Hepatitis B test. Screening   Diabetes screening. This is done by checking your blood sugar (glucose) after you have not eaten for a while (fasting).  Sexually transmitted disease (STD) testing. Talk with your health care provider about your test results, treatment options, and if necessary, the need for more tests. Follow these instructions at home: Eating and drinking   Eat a diet that includes fresh fruits and vegetables, whole grains, lean protein, and low-fat dairy products.  Take vitamin and mineral supplements as recommended by your health care provider.  Do not drink alcohol if your health care provider tells you not to drink.  If you drink alcohol: ? Limit how much you have to 0-2  drinks a day. ? Be aware of how much alcohol is in your drink. In the [place] one drink equals one 12 oz bottle of beer (355 mL), one 5 oz glass of wine (148 mL), or one 1 oz glass of hard liquor (44 mL). Lifestyle  Take daily care of your teeth and gums.  Stay active. Exercise for at least 30 minutes on 5 or  more days each week.  Do not use any products that contain nicotine or tobacco, such as cigarettes, e-cigarettes, and chewing tobacco. If you need help quitting, ask your health care provider.  If you are sexually active, practice safe sex. Use a condom or other form of protection to prevent STIs (sexually transmitted infections). What's next?  Go to your health care provider once a year for a well check visit.  Ask your health care provider how often you should have your eyes and teeth checked.  Stay up to date on all vaccines. This information is not intended to replace advice given to you by your health care provider. Make sure you discuss any questions you have with your health care provider. Document Revised: 07/18/2018 Document Reviewed: 07/18/2018 Elsevier Patient Education  2020 [employer].

## 2020-03-16 LAB — HEPATITIS C ANTIBODY
Hepatitis C Ab: NONREACTIVE
SIGNAL TO CUT-OFF: 0.01 (ref <1.00)

## 2020-03-20 ENCOUNTER — Encounter: Payer: Self-pay | Admitting: Family Medicine

## 2020-10-25 ENCOUNTER — Other Ambulatory Visit (HOSPITAL_COMMUNITY): Payer: Self-pay | Admitting: General Practice

## 2021-03-17 ENCOUNTER — Other Ambulatory Visit: Payer: Self-pay

## 2021-03-17 ENCOUNTER — Ambulatory Visit (INDEPENDENT_AMBULATORY_CARE_PROVIDER_SITE_OTHER): Payer: 59 | Admitting: Family Medicine

## 2021-03-17 ENCOUNTER — Encounter: Payer: Self-pay | Admitting: Family Medicine

## 2021-03-17 VITALS — BP 110/80 | HR 60 | Temp 98.7°F | Resp 18 | Ht 63.0 in | Wt 190.2 lb

## 2021-03-17 DIAGNOSIS — Z0000 Encounter for general adult medical examination without abnormal findings: Secondary | ICD-10-CM

## 2021-03-17 LAB — CBC WITH DIFFERENTIAL/PLATELET
Basophils Absolute: 0.1 10*3/uL (ref 0.0–0.1)
Basophils Relative: 0.8 % (ref 0.0–3.0)
Eosinophils Absolute: 0.1 10*3/uL (ref 0.0–0.7)
Eosinophils Relative: 1.9 % (ref 0.0–5.0)
HCT: 46.7 % (ref 39.0–52.0)
Hemoglobin: 15.6 g/dL (ref 13.0–17.0)
Lymphocytes Relative: 24.4 % (ref 12.0–46.0)
Lymphs Abs: 1.8 10*3/uL (ref 0.7–4.0)
MCHC: 33.3 g/dL (ref 30.0–36.0)
MCV: 86.9 fl (ref 78.0–100.0)
Monocytes Absolute: 1 10*3/uL (ref 0.1–1.0)
Monocytes Relative: 14.1 % — ABNORMAL HIGH (ref 3.0–12.0)
Neutro Abs: 4.3 10*3/uL (ref 1.4–7.7)
Neutrophils Relative %: 58.8 % (ref 43.0–77.0)
Platelets: 261 10*3/uL (ref 150.0–400.0)
RBC: 5.38 Mil/uL (ref 4.22–5.81)
RDW: 12.8 % (ref 11.5–15.5)
WBC: 7.3 10*3/uL (ref 4.0–10.5)

## 2021-03-17 LAB — TSH: TSH: 5.16 u[IU]/mL (ref 0.35–5.50)

## 2021-03-17 LAB — COMPREHENSIVE METABOLIC PANEL
ALT: 25 U/L (ref 0–53)
AST: 17 U/L (ref 0–37)
Albumin: 4.4 g/dL (ref 3.5–5.2)
Alkaline Phosphatase: 74 U/L (ref 39–117)
BUN: 17 mg/dL (ref 6–23)
CO2: 29 mEq/L (ref 19–32)
Calcium: 9.6 mg/dL (ref 8.4–10.5)
Chloride: 103 mEq/L (ref 96–112)
Creatinine, Ser: 0.8 mg/dL (ref 0.40–1.50)
GFR: 111.57 mL/min (ref >60.00)
Glucose, Bld: 80 mg/dL (ref 70–99)
Potassium: 4.4 mEq/L (ref 3.5–5.1)
Sodium: 139 mEq/L (ref 135–145)
Total Bilirubin: 0.6 mg/dL (ref 0.2–1.2)
Total Protein: 7 g/dL (ref 6.0–8.3)

## 2021-03-17 LAB — LIPID PANEL
Cholesterol: 205 mg/dL — ABNORMAL HIGH (ref 0–200)
HDL: 34.9 mg/dL — ABNORMAL LOW (ref >39.00)
NonHDL: 170.48
Total CHOL/HDL Ratio: 6
Triglycerides: 209 mg/dL — ABNORMAL HIGH (ref 0.0–149.0)
VLDL: 41.8 mg/dL — ABNORMAL HIGH (ref 0.0–40.0)

## 2021-03-17 LAB — LDL CHOLESTEROL, DIRECT: Direct LDL: 153 mg/dL

## 2021-03-17 NOTE — Progress Notes (Signed)
Subjective:   By signing my name below, I, Alex Hamilton, attest that this documentation has been prepared under the direction and in the presence of Dr. Seabron Spates, DO. 03/17/2021    Patient ID: Alex Hamilton, male    DOB: 12/07/1980, 40 y.o.   MRN: [id number]  Chief Complaint  Patient presents with   Annual Exam    Pt states fasting     HPI Patient is in today for a comprehensive physical exam.  He reports having a discolored spot on his scalp that is flat and has not changed since he discovered it.  He is managing his allergies well at this time.  He does not remember the type of heart murmer he is diagnosed with.  He denies having any fever, ear pain, congestion, sinus pain, sore throat, eye pain, chest pain, palpations, cough, SOB, wheezing, n/v/d, constipation, blood in stool, dysuria, frequency, hematuria, or headaches at this time. He has no recent changes in his family medical history.  He does not smoke tobacco products. He does not use vaping products. He has 3 Covid-19 vaccines at this time.    Past Medical History:  Diagnosis Date   Allergy    seasonal   Blood transfusion reaction    Heart murmur     Past Surgical History:  Procedure Laterality Date   EXPLORATION POST OPERATIVE OPEN HEART  1992    Family History  Problem Relation Age of Onset   Brain cancer Father 78   Heart attack Father 95       MI   Lung cancer Father 67   Cancer Paternal Uncle        spine   Cancer Paternal Uncle        brain   Colon cancer Mother 25   Stroke Paternal Grandmother    Stroke Maternal Grandmother    Heart attack Maternal Grandfather    Prostate cancer Maternal Grandfather     Social History   Socioeconomic History   Marital status: Married    Spouse name: Not on file   Number of children: Not on file   Years of education: Not on file   Highest education level: Not on file  Occupational History   Occupation: paramedic    Employer: [employer]     Comment: [employer]  Tobacco Use   Smoking status: Never   Smokeless tobacco: Never  Vaping Use   Vaping Use: Never used  Substance and Sexual Activity   Alcohol use: Yes    Alcohol/week: 0.0 standard drinks   Drug use: No   Sexual activity: Yes    Partners: Female  Other Topics Concern   Not on file  Social History Narrative   30 min walk / day 3x a week   Social Determinants of Health   Financial Resource Strain: Not on file  Food Insecurity: Not on file  Transportation Needs: Not on file  Physical Activity: Not on file  Stress: Not on file  Social Connections: Not on file  Intimate Partner Violence: Not on file    Outpatient Medications Prior to Visit  Medication Sig Dispense Refill   cetirizine (ZYRTEC) 10 MG tablet Take 10 mg by mouth daily.     fluticasone (FLONASE) 50 MCG/ACT nasal spray Place into both nostrils daily.     amoxicillin (AMOXIL) 500 MG capsule TAKE 4 CAPSULES BY MOUTH ONE HOUR PRIOR TO DENTAL APPOINTMENT (Patient not taking: Reported on 03/17/2021) 8 capsule 1   No facility-administered medications prior  to visit.    Allergies  Allergen Reactions   Ceclor [Cefaclor] Other (See Comments)    Unsure- mother told him of allergy    Demerol [Meperidine] Other (See Comments)    Unsure- mother told him of allergy    Naldecon Senior [Guaifenesin] Other (See Comments)    Unsure- mother told him of allergy    Review of Systems  Constitutional:  Negative for fever.  HENT:  Negative for congestion, ear pain, sinus pain and sore throat.   Eyes:  Negative for pain.  Respiratory:  Negative for cough, shortness of breath and wheezing.   Cardiovascular:  Negative for chest pain and palpitations.  Gastrointestinal:  Negative for blood in stool, constipation, diarrhea, nausea and vomiting.  Genitourinary:  Negative for dysuria, frequency and hematuria.  Skin:        (+)discolored spot on top of scalp  Neurological:  Negative for headaches.   Psychiatric/Behavioral:  Negative for depression. The patient is not nervous/anxious.       Objective:    Physical Exam Constitutional:      General: He is not in acute distress.    Appearance: Normal appearance. He is not ill-appearing.  HENT:     Head: Normocephalic and atraumatic.     Right Ear: Tympanic membrane, ear canal and external ear normal.     Left Ear: Tympanic membrane, ear canal and external ear normal.  Eyes:     Extraocular Movements: Extraocular movements intact.     Pupils: Pupils are equal, round, and reactive to light.  Cardiovascular:     Rate and Rhythm: Normal rate and regular rhythm.     Heart sounds: Murmur heard.  Systolic murmur is present with a grade of 2/6.    No gallop.  Pulmonary:     Effort: Pulmonary effort is normal. No respiratory distress.     Breath sounds: Normal breath sounds. No wheezing or rales.  Abdominal:     General: Bowel sounds are normal. There is no distension.     Palpations: Abdomen is soft.     Tenderness: There is no abdominal tenderness. There is no guarding.  Musculoskeletal:        General: Normal range of motion.     Right lower leg: No edema.     Left lower leg: No edema.  Skin:    General: Skin is warm and dry.     Comments: Flat 3/4 in diameter, flat and brown colored evenly. Has not changed since its discovery  Neurological:     Mental Status: He is alert and oriented to person, place, and time.  Psychiatric:        Mood and Affect: Mood normal.        Behavior: Behavior normal.        Thought Content: Thought content normal.        Judgment: Judgment normal.    BP 110/80 (BP Location: Right Arm, Patient Position: Sitting, Cuff Size: Normal)   Pulse 60   Temp 98.7 F (37.1 C) (Oral)   Resp 18   Ht 5\' 3"  (1.6 m)   Wt 190 lb 3.2 oz (86.3 kg)   SpO2 97%   BMI 33.69 kg/m  Wt Readings from Last 3 Encounters:  03/17/21 190 lb 3.2 oz (86.3 kg)  03/15/20 181 lb (82.1 kg)  01/20/19 181 lb 9.6 oz (82.4 kg)     Diabetic Foot Exam - Simple   No data filed    Lab Results  Component Value  Date   WBC 6.9 03/15/2020   HGB 15.5 03/15/2020   HCT 45.3 03/15/2020   PLT 273.0 03/15/2020   GLUCOSE 87 03/15/2020   CHOL 193 03/15/2020   TRIG 136.0 03/15/2020   HDL 41.60 03/15/2020   LDLCALC 125 (H) 03/15/2020   ALT 25 03/15/2020   AST 18 03/15/2020   NA 139 03/15/2020   K 4.0 03/15/2020   CL 103 03/15/2020   CREATININE 0.87 03/15/2020   BUN 11 03/15/2020   CO2 26 03/15/2020   TSH 2.79 03/15/2020    Lab Results  Component Value Date   TSH 2.79 03/15/2020   Lab Results  Component Value Date   WBC 6.9 03/15/2020   HGB 15.5 03/15/2020   HCT 45.3 03/15/2020   MCV 88.1 03/15/2020   PLT 273.0 03/15/2020   Lab Results  Component Value Date   NA 139 03/15/2020   K 4.0 03/15/2020   CO2 26 03/15/2020   GLUCOSE 87 03/15/2020   BUN 11 03/15/2020   CREATININE 0.87 03/15/2020   BILITOT 0.6 03/15/2020   ALKPHOS 68 03/15/2020   AST 18 03/15/2020   ALT 25 03/15/2020   PROT 7.5 03/15/2020   ALBUMIN 4.6 03/15/2020   CALCIUM 9.7 03/15/2020   GFR 98.00 03/15/2020   Lab Results  Component Value Date   CHOL 193 03/15/2020   Lab Results  Component Value Date   HDL 41.60 03/15/2020   Lab Results  Component Value Date   LDLCALC 125 (H) 03/15/2020   Lab Results  Component Value Date   TRIG 136.0 03/15/2020   Lab Results  Component Value Date   CHOLHDL 5 03/15/2020   No results found for: HGBA1C      Assessment & Plan:   Problem List Items Addressed This Visit       Unprioritized   Preventative health care - Primary   Relevant Orders   Lipid panel   CBC with Differential/Platelet   TSH   Comprehensive metabolic panel     No orders of the defined types were placed in this encounter.   I, Dr. Seabron Spates, DO, personally preformed the services described in this documentation.  All medical record entries made by the scribe were at my direction and in my  presence.  I have reviewed the chart and discharge instructions (if applicable) and agree that the record reflects my personal performance and is accurate and complete. 03/17/2021   I,Alex Hamilton,acting as a Neurosurgeon for [employer], DO.,have documented all relevant documentation on the behalf of Donato Schultz, DO,as directed by  Donato Schultz, DO while in the presence of Donato Schultz, DO.   Donato Schultz, DO

## 2021-03-17 NOTE — Assessment & Plan Note (Signed)
ghm utd Check labs  See avs  

## 2021-03-17 NOTE — Patient Instructions (Signed)
Preventive Care 21-39 Years Old, Male Preventive care refers to lifestyle choices and visits with your health care provider that can promote health and wellness. This includes: A yearly physical exam. This is also called an annual wellness visit. Regular dental and eye exams. Immunizations. Screening for certain conditions. Healthy lifestyle choices, such as: Eating a healthy diet. Getting regular exercise. Not using drugs or products that contain nicotine and tobacco. Limiting alcohol use. What can I expect for my preventive care visit? Physical exam Your health care provider may check your: Height and weight. These may be used to calculate your BMI (body mass index). BMI is a measurement that tells if you are at a healthy weight. Heart rate and blood pressure. Body temperature. Skin for abnormal spots. Counseling Your health care provider may ask you questions about your: Past medical problems. Family's medical history. Alcohol, tobacco, and drug use. Emotional well-being. Home life and relationship well-being. Sexual activity. Diet, exercise, and sleep habits. Work and work environment. Access to firearms. What immunizations do I need?  Vaccines are usually given at various ages, according to a schedule. Your health care provider will recommend vaccines for you based on your age, medicalhistory, and lifestyle or other factors, such as travel or where you work. What tests do I need? Blood tests Lipid and cholesterol levels. These may be checked every 5 years starting at age 20. Hepatitis C test. Hepatitis B test. Screening  Diabetes screening. This is done by checking your blood sugar (glucose) after you have not eaten for a while (fasting). Genital exam to check for testicular cancer or hernias. STD (sexually transmitted disease) testing, if you are at risk. Talk with your health care provider about your test results, treatment options,and if necessary, the need for more  tests. Follow these instructions at home: Eating and drinking  Eat a healthy diet that includes fresh fruits and vegetables, whole grains, lean protein, and low-fat dairy products. Drink enough fluid to keep your urine pale yellow. Take vitamin and mineral supplements as recommended by your health care provider. Do not drink alcohol if your health care provider tells you not to drink. If you drink alcohol: Limit how much you have to 0-2 drinks a day. Be aware of how much alcohol is in your drink. In the [place] one drink equals one 12 oz bottle of beer (355 mL), one 5 oz glass of wine (148 mL), or one 1 oz glass of hard liquor (44 mL).  Lifestyle Take daily care of your teeth and gums. Brush your teeth every morning and night with fluoride toothpaste. Floss one time each day. Stay active. Exercise for at least 30 minutes 5 or more days each week. Do not use any products that contain nicotine or tobacco, such as cigarettes, e-cigarettes, and chewing tobacco. If you need help quitting, ask your health care provider. Do not use drugs. If you are sexually active, practice safe sex. Use a condom or other form of protection to prevent STIs (sexually transmitted infections). Find healthy ways to cope with stress, such as: Meditation, yoga, or listening to music. Journaling. Talking to a trusted person. Spending time with friends and family. Safety Always wear your seat belt while driving or riding in a vehicle. Do not drive: If you have been drinking alcohol. Do not ride with someone who has been drinking. When you are tired or distracted. While texting. Wear a helmet and other protective equipment during sports activities. If you have firearms in your house, make sure   you follow all gun safety procedures. Seek help if you have been physically or sexually abused. What's next? Go to your health care provider once a year for an annual wellness visit. Ask your health care provider how often  you should have your eyes and teeth checked. Stay up to date on all vaccines. This information is not intended to replace advice given to you by your health care provider. Make sure you discuss any questions you have with your healthcare provider. Document Revised: 04/09/2019 Document Reviewed: 07/18/2018 Elsevier Patient Education  2022 Else[employer] Inc[employer]  

## 2021-03-21 ENCOUNTER — Other Ambulatory Visit: Payer: Self-pay | Admitting: Family Medicine

## 2021-03-21 DIAGNOSIS — E785 Hyperlipidemia, unspecified: Secondary | ICD-10-CM

## 2021-04-18 ENCOUNTER — Other Ambulatory Visit (HOSPITAL_COMMUNITY): Payer: Self-pay

## 2021-04-18 MED ORDER — CARESTART COVID-19 HOME TEST VI KIT
PACK | 0 refills | Status: DC
  Filled 2021-04-18: qty 4, 4d supply, fill #0

## 2021-05-03 ENCOUNTER — Other Ambulatory Visit (HOSPITAL_COMMUNITY): Payer: Self-pay

## 2021-05-03 MED ORDER — AMOXICILLIN 500 MG PO CAPS
2000.0000 mg | ORAL_CAPSULE | ORAL | 1 refills | Status: DC
  Filled 2021-05-03: qty 8, 2d supply, fill #0

## 2021-05-09 ENCOUNTER — Encounter: Payer: Self-pay | Admitting: Family Medicine

## 2021-05-19 ENCOUNTER — Encounter: Payer: Self-pay | Admitting: Family Medicine

## 2021-05-20 DIAGNOSIS — Z23 Encounter for immunization: Secondary | ICD-10-CM | POA: Diagnosis not present

## 2021-07-22 ENCOUNTER — Other Ambulatory Visit: Payer: Self-pay

## 2021-07-22 ENCOUNTER — Telehealth: Payer: 59 | Admitting: Family

## 2021-07-22 ENCOUNTER — Encounter: Payer: Self-pay | Admitting: Family Medicine

## 2021-07-22 DIAGNOSIS — R6889 Other general symptoms and signs: Secondary | ICD-10-CM | POA: Diagnosis not present

## 2021-07-22 MED ORDER — OSELTAMIVIR PHOSPHATE 75 MG PO CAPS
75.0000 mg | ORAL_CAPSULE | Freq: Two times a day (BID) | ORAL | 0 refills | Status: DC
Start: 2021-07-22 — End: 2022-04-03
  Filled 2021-07-22: qty 10, 5d supply, fill #0

## 2021-07-22 NOTE — Progress Notes (Signed)

## 2021-07-23 ENCOUNTER — Telehealth: Payer: 59 | Admitting: Nurse Practitioner

## 2021-07-23 DIAGNOSIS — U071 COVID-19: Secondary | ICD-10-CM

## 2021-07-23 MED ORDER — MOLNUPIRAVIR EUA 200MG CAPSULE: 4.0000 | ORAL_CAPSULE | Freq: Two times a day (BID) | ORAL | 0 refills | Status: AC

## 2021-07-23 NOTE — Progress Notes (Signed)
Virtual Visit Consent   Alex Hamilton, you are scheduled for a virtual visit with Mary-Margaret Hassell Done, [place], a [facility] provider, today.     Just as with appointments in the office, your consent must be obtained to participate.  Your consent will be active for this visit and any virtual visit you may have with one of our providers in the next 365 days.     If you have a MyChart account, a copy of this consent can be sent to you electronically.  All virtual visits are billed to your insurance company just like a traditional visit in the office.    As this is a virtual visit, video technology does not allow for your provider to perform a traditional examination.  This may limit your provider's ability to fully assess your condition.  If your provider identifies any concerns that need to be evaluated in person or the need to arrange testing (such as labs, EKG, etc.), we will make arrangements to do so.     Although advances in technology are sophisticated, we cannot ensure that it will always work on either your end or our end.  If the connection with a video visit is poor, the visit may have to be switched to a telephone visit.  With either a video or telephone visit, we are not always able to ensure that we have a secure connection.     I need to obtain your verbal consent now.   Are you willing to proceed with your visit today? YES   Kemond Amorin has provided verbal consent on 07/23/2021 for a virtual visit (video or telephone).   Mary-Margaret Hassell Done, FNP   Date: 07/23/2021 11:06 AM   Virtual Visit via Video Note   I, Mary-Margaret Hassell Done, connected with Alex Hamilton ([id number], [birth year]) on 07/23/21 at 11:15 AM EST by a video-enabled telemedicine application and verified that I am speaking with the correct person using two identifiers.  Location: Patient: Virtual Visit Location Patient: Home Provider: Virtual Visit Location Provider: Mobile   I discussed the  limitations of evaluation and management by telemedicine and the availability of in person appointments. The patient expressed understanding and agreed to proceed.    History of Present Illness: Alex Hamilton is a 40 y.o. who identifies as a male who was assigned male at birth, and is being seen today for covid positive.  HPI: Patient says Thursday night he started having chest tightness. Developed a cough by Friday. 2 covid test were negative. Did evist yesterday and was dx with flu. Was given tamiflu. As evening worse on her became achy with worsening cough. This morning he felt so bad he did another covid test and was positive.   Review of Systems  Constitutional:  Positive for chills, fever and malaise/fatigue.  HENT:  Positive for congestion and sore throat.   Respiratory:  Positive for cough.   Musculoskeletal:  Positive for myalgias.  Neurological:  Positive for headaches.   Problems:  Patient Active Problem List   Diagnosis Date Noted   Preventative health care 03/12/2015    Allergies:  Allergies  Allergen Reactions   Cefaclor Other (See Comments)    Unsure- mother told him of allergy tolerates amoxicillin per pt   Demerol [Meperidine] Other (See Comments)    Unsure- mother told him of allergy    Naldecon Senior [Guaifenesin] Other (See Comments)    Unsure- mother told him of allergy   Medications:  Current Outpatient Medications:    amoxicillin (AMOXIL)  500 MG capsule, Take 4 capsules (2,000 mg total) by mouth 1 hour prior to dental appotiment for pre-medication, Disp: 8 capsule, Rfl: 1   cetirizine (ZYRTEC) 10 MG tablet, Take 10 mg by mouth daily., Disp: , Rfl:    COVID-19 At Home Antigen Test [facility] COVID-19 HOME TEST) KIT, Use as directed, Disp: 4 each, Rfl: 0   fluticasone (FLONASE) 50 MCG/ACT nasal spray, Place into both nostrils daily., Disp: , Rfl:    oseltamivir (TAMIFLU) 75 MG capsule, Take 1 capsule (75 mg total) by mouth 2 (two) times daily., Disp: 10  capsule, Rfl: 0  Observations/Objective: Patient is well-developed, well-nourished in no acute distress.  Resting comfortably  at home.  Head is normocephalic, atraumatic.  No labored breathing.  Speech is clear and coherent with logical content.  Patient is alert and oriented at baseline.  Deep dry cough Raspy voice  Assessment and Plan:  Alex Hamilton in today with chief complaint of covid positive  1. Lab test positive for detection of COVID-19 virus 1. Take meds as prescribed 2. Use a cool mist humidifier especially during the winter months and when heat has been humid. 3. Use saline nose sprays frequently 4. Saline irrigations of the nose can be very helpful if done frequently.  * 4X daily for 1 week*  * Use of a nettie pot can be helpful with this. Follow directions with this* 5. Drink plenty of fluids 6. Keep thermostat turn down low 7.For any cough or congestion- robitussin 8. For fever or aces or pains- take tylenol or ibuprofen appropriate for age and weight.  * for fevers greater than 101 orally you may alternate ibuprofen and tylenol every  3 hours.   Stop tamiflu Meds ordered this encounter  Medications   molnupiravir EUA (LAGEVRIO) 200 mg CAPS capsule    Sig: Take 4 capsules (800 mg total) by mouth 2 (two) times daily for 5 days.    Dispense:  40 capsule    Refill:  0    Order Specific Question:   Supervising Provider    Answer:   Noemi Chapel [[id number]]       Follow Up Instructions: I discussed the assessment and treatment plan with the patient. The patient was provided an opportunity to ask questions and all were answered. The patient agreed with the plan and demonstrated an understanding of the instructions.  A copy of instructions were sent to the patient via MyChart.  The patient was advised to call back or seek an in-person evaluation if the symptoms worsen or if the condition fails to improve as anticipated.  Time:  I spent 9 minutes with the  patient via telehealth technology discussing the above problems/concerns.    Mary-Margaret Hassell Done, FNP

## 2021-07-23 NOTE — Patient Instructions (Signed)
You are being prescribed MOLNUPIRAVIR for COVID-19 infection.  ° ° °Please call the pharmacy or go through the drive through vs going inside if you are picking up the mediation yourself to prevent further spread. If prescribed to a [facility] affiliated pharmacy, a pharmacist will bring the medication out to your car. ° ° °ADMINISTRATION INSTRUCTIONS: °Take with or without food. Swallow the tablets whole. Don't chew, crush, or break the medications because it might not work as well ° °For each dose of the medication, you should be taking FOUR tablets at one time, TWICE a day  ° °Finish your full five-day course of Molnupiravir even if you feel better before you're done. Stopping this medication too early can make it less effective to prevent severe illness related to COVID19.   ° °Molnupiravir is prescribed for YOU ONLY. Don't share it with others, even if they have similar symptoms as you. This medication might not be right for everyone.  ° °Make sure to take steps to protect yourself and others while you're taking this medication in order to get well soon and to prevent others from getting sick with COVID-19. ° ° °**If you are of childbearing potential (any gender) - it is advised to not get pregnant while taking this medication and recommended that condoms are used for male partners the next 3 months after taking the medication out of extreme caution  ° ° °COMMON SIDE EFFECTS: °Diarrhea °Nausea  °Dizziness ° ° ° °If your COVID-19 symptoms get worse, get medical help right away. Call 911 if you experience symptoms such as worsening cough, trouble breathing, chest pain that doesn't go away, confusion, a hard time staying awake, and pale or blue-colored skin. °This medication won't prevent all COVID-19 cases from getting worse.  ° ° °

## 2021-07-28 ENCOUNTER — Other Ambulatory Visit: Payer: Self-pay | Admitting: Family

## 2022-03-20 ENCOUNTER — Encounter: Payer: 59 | Admitting: Family Medicine

## 2022-04-03 ENCOUNTER — Ambulatory Visit (INDEPENDENT_AMBULATORY_CARE_PROVIDER_SITE_OTHER): Payer: 59 | Admitting: Family Medicine

## 2022-04-03 ENCOUNTER — Encounter: Payer: Self-pay | Admitting: Family Medicine

## 2022-04-03 VITALS — BP 118/82 | HR 64 | Temp 98.0°F | Ht 63.0 in | Wt 190.8 lb

## 2022-04-03 DIAGNOSIS — Z0000 Encounter for general adult medical examination without abnormal findings: Secondary | ICD-10-CM

## 2022-04-03 DIAGNOSIS — E785 Hyperlipidemia, unspecified: Secondary | ICD-10-CM

## 2022-04-03 DIAGNOSIS — Z125 Encounter for screening for malignant neoplasm of prostate: Secondary | ICD-10-CM | POA: Diagnosis not present

## 2022-04-03 NOTE — Progress Notes (Signed)
Established Patient Office Visit  Subjective   Patient ID: Alex Hamilton, male    DOB: 30-Apr-1981  Age: 41 y.o. MRN: [id number]  Chief Complaint  Patient presents with   Annual Exam    CPE    HPI Pt here for cpe.  No complaints   Patient Active Problem List   Diagnosis Date Noted   Preventative health care 03/12/2015   Past Medical History:  Diagnosis Date   Allergy    seasonal   Blood transfusion reaction    Heart murmur    Past Surgical History:  Procedure Laterality Date   EXPLORATION POST OPERATIVE OPEN HEART  1992   Social History   Tobacco Use   Smoking status: Never   Smokeless tobacco: Never  Vaping Use   Vaping Use: Never used  Substance Use Topics   Alcohol use: Yes    Alcohol/week: 0.0 standard drinks of alcohol   Drug use: No   Social History   Socioeconomic History   Marital status: Married    Spouse name: Not on file   Number of children: Not on file   Years of education: Not on file   Highest education level: Not on file  Occupational History   Occupation: paramedic    Employer: [employer]    Comment: [employer]  Tobacco Use   Smoking status: Never   Smokeless tobacco: Never  Vaping Use   Vaping Use: Never used  Substance and Sexual Activity   Alcohol use: Yes    Alcohol/week: 0.0 standard drinks of alcohol   Drug use: No   Sexual activity: Yes    Partners: Female  Other Topics Concern   Not on file  Social History Narrative   30 min walk / day 3x a week   Social Determinants of Corporate investment banker Strain: Not on file  Food Insecurity: Not on file  Transportation Needs: Not on file  Physical Activity: Not on file  Stress: Not on file  Social Connections: Not on file  Intimate Partner Violence: Not on file   Family Status  Relation Name Status   Father  Deceased at age 68       MI   Pat Uncle  Deceased   Pat Uncle  Deceased   Mother  Alive   PGM  Deceased   PGF  Deceased at age 48       MI   MGM  (Not  Specified)   MGF  (Not Specified)   Family History  Problem Relation Age of Onset   Brain cancer Father 58   Heart attack Father 41       MI   Lung cancer Father 48   Cancer Paternal Uncle        spine   Cancer Paternal Uncle        brain   Colon cancer Mother 70   Stroke Paternal Grandmother    Stroke Maternal Grandmother    Heart attack Maternal Grandfather    Prostate cancer Maternal Grandfather    Allergies  Allergen Reactions   Cefaclor Other (See Comments)    Unsure- mother told him of allergy tolerates amoxicillin per pt   Demerol [Meperidine] Other (See Comments)    Unsure- mother told him of allergy    Naldecon Senior [Guaifenesin] Other (See Comments)    Unsure- mother told him of allergy      Review of Systems  Constitutional:  Negative for chills, fever and malaise/fatigue.  HENT:  Negative  for congestion and hearing loss.   Eyes:  Negative for blurred vision and discharge.  Respiratory:  Negative for cough, sputum production and shortness of breath.   Cardiovascular:  Negative for chest pain, palpitations and leg swelling.  Gastrointestinal:  Negative for abdominal pain, blood in stool, constipation, diarrhea, heartburn, nausea and vomiting.  Genitourinary:  Negative for dysuria, frequency, hematuria and urgency.  Musculoskeletal:  Negative for back pain, falls and myalgias.  Skin:  Negative for rash.  Neurological:  Negative for dizziness, sensory change, loss of consciousness, weakness and headaches.  Endo/Heme/Allergies:  Negative for environmental allergies. Does not bruise/bleed easily.  Psychiatric/Behavioral:  Negative for depression and suicidal ideas. The patient is not nervous/anxious and does not have insomnia.       Objective:     BP 118/82   Pulse 64   Temp 98 F (36.7 C) (Oral)   Ht 5\' 3"  (1.6 m)   Wt 190 lb 12.8 oz (86.5 kg)   SpO2 98%   BMI 33.80 kg/m  BP Readings from Last 3 Encounters:  04/03/22 118/82  03/17/21 110/80   03/15/20 110/80   Wt Readings from Last 3 Encounters:  04/03/22 190 lb 12.8 oz (86.5 kg)  03/17/21 190 lb 3.2 oz (86.3 kg)  03/15/20 181 lb (82.1 kg)   SpO2 Readings from Last 3 Encounters:  04/03/22 98%  03/17/21 97%  03/15/20 99%      Physical Exam Vitals and nursing note reviewed.  Constitutional:      Appearance: He is well-developed.  HENT:     Head: Normocephalic and atraumatic.  Eyes:     Pupils: Pupils are equal, round, and reactive to light.  Neck:     Thyroid: No thyromegaly.  Cardiovascular:     Rate and Rhythm: Normal rate and regular rhythm.     Heart sounds: No murmur heard. Pulmonary:     Effort: Pulmonary effort is normal. No respiratory distress.     Breath sounds: Normal breath sounds. No wheezing or rales.  Chest:     Chest wall: No tenderness.  Musculoskeletal:     Cervical back: Normal range of motion and neck supple.     Right hip: Tenderness present. Normal range of motion. Normal strength.     Left hip: Tenderness present. Normal range of motion. Normal strength.     Right foot: Bony tenderness present. No swelling.     Left foot: Bony tenderness present. No swelling.  Skin:    General: Skin is warm and dry.  Neurological:     Mental Status: He is alert and oriented to person, place, and time.  Psychiatric:        Behavior: Behavior normal.        Thought Content: Thought content normal.        Judgment: Judgment normal.      No results found for any visits on 04/03/22.  Last CBC Lab Results  Component Value Date   WBC 7.3 03/17/2021   HGB 15.6 03/17/2021   HCT 46.7 03/17/2021   MCV 86.9 03/17/2021   RDW 12.8 03/17/2021   PLT 261.0 03/17/2021   Last metabolic panel Lab Results  Component Value Date   GLUCOSE 80 03/17/2021   NA 139 03/17/2021   K 4.4 03/17/2021   CL 103 03/17/2021   CO2 29 03/17/2021   BUN 17 03/17/2021   CREATININE 0.80 03/17/2021   CALCIUM 9.6 03/17/2021   PROT 7.0 03/17/2021   ALBUMIN 4.4 03/17/2021    BILITOT  0.6 03/17/2021   ALKPHOS 74 03/17/2021   AST 17 03/17/2021   ALT 25 03/17/2021   Last lipids Lab Results  Component Value Date   CHOL 205 (H) 03/17/2021   HDL 34.90 (L) 03/17/2021   LDLCALC 125 (H) 03/15/2020   LDLDIRECT 153.0 03/17/2021   TRIG 209.0 (H) 03/17/2021   CHOLHDL 6 03/17/2021   Last hemoglobin A1c No results found for: "HGBA1C" Last thyroid functions Lab Results  Component Value Date   TSH 5.16 03/17/2021   Last vitamin D No results found for: "25OHVITD2", "25OHVITD3", "VD25OH" Last vitamin B12 and Folate No results found for: "VITAMINB12", "FOLATE"    The 10-year ASCVD risk score (Arnett DK, et al., 2019) is: 1.7%    Assessment & Plan:   Problem List Items Addressed This Visit       Unprioritized   Preventative health care - Primary    ghm utd Check labs  See avs       Relevant Orders   CBC with Differential/Platelet   Comprehensive metabolic panel   Lipid panel   TSH   PSA   Other Visit Diagnoses     Hyperlipidemia, unspecified hyperlipidemia type       Relevant Orders   Comprehensive metabolic panel   Lipid panel       Return in about 1 year (around 04/04/2023), or if symptoms worsen or fail to improve, for annual exam, fasting.    Donato Schultz, DO

## 2022-04-03 NOTE — Patient Instructions (Signed)

## 2022-04-03 NOTE — Assessment & Plan Note (Signed)
ghm utd Check labs  See avs  

## 2022-04-04 LAB — CBC WITH DIFFERENTIAL/PLATELET
Basophils Absolute: 0.1 10*3/uL (ref 0.0–0.1)
Basophils Relative: 0.8 % (ref 0.0–3.0)
Eosinophils Absolute: 0.1 10*3/uL (ref 0.0–0.7)
Eosinophils Relative: 1.4 % (ref 0.0–5.0)
HCT: 46.5 % (ref 39.0–52.0)
Hemoglobin: 15.5 g/dL (ref 13.0–17.0)
Lymphocytes Relative: 17.3 % (ref 12.0–46.0)
Lymphs Abs: 1.4 10*3/uL (ref 0.7–4.0)
MCHC: 33.3 g/dL (ref 30.0–36.0)
MCV: 88.4 fl (ref 78.0–100.0)
Monocytes Absolute: 0.8 10*3/uL (ref 0.1–1.0)
Monocytes Relative: 10.1 % (ref 3.0–12.0)
Neutro Abs: 5.8 10*3/uL (ref 1.4–7.7)
Neutrophils Relative %: 70.4 % (ref 43.0–77.0)
Platelets: 258 10*3/uL (ref 150.0–400.0)
RBC: 5.26 Mil/uL (ref 4.22–5.81)
RDW: 12.8 % (ref 11.5–15.5)
WBC: 8.2 10*3/uL (ref 4.0–10.5)

## 2022-04-04 LAB — LIPID PANEL
Cholesterol: 254 mg/dL — ABNORMAL HIGH (ref 0–200)
HDL: 37.8 mg/dL — ABNORMAL LOW (ref >39.00)
LDL Cholesterol: 177 mg/dL — ABNORMAL HIGH (ref 0–99)
NonHDL: 215.91
Total CHOL/HDL Ratio: 7
Triglycerides: 197 mg/dL — ABNORMAL HIGH (ref 0.0–149.0)
VLDL: 39.4 mg/dL (ref 0.0–40.0)

## 2022-04-04 LAB — COMPREHENSIVE METABOLIC PANEL
ALT: 38 U/L (ref 0–53)
AST: 22 U/L (ref 0–37)
Albumin: 4.6 g/dL (ref 3.5–5.2)
Alkaline Phosphatase: 72 U/L (ref 39–117)
BUN: 12 mg/dL (ref 6–23)
CO2: 27 mEq/L (ref 19–32)
Calcium: 9.7 mg/dL (ref 8.4–10.5)
Chloride: 102 mEq/L (ref 96–112)
Creatinine, Ser: 0.89 mg/dL (ref 0.40–1.50)
GFR: 107.24 mL/min (ref >60.00)
Glucose, Bld: 89 mg/dL (ref 70–99)
Potassium: 4.7 mEq/L (ref 3.5–5.1)
Sodium: 140 mEq/L (ref 135–145)
Total Bilirubin: 0.7 mg/dL (ref 0.2–1.2)
Total Protein: 7.3 g/dL (ref 6.0–8.3)

## 2022-04-04 LAB — PSA: PSA: 0.4 ng/mL (ref 0.10–4.00)

## 2022-04-04 LAB — TSH: TSH: 3.22 u[IU]/mL (ref 0.35–5.50)

## 2022-04-10 ENCOUNTER — Other Ambulatory Visit: Payer: Self-pay | Admitting: Family Medicine

## 2022-04-10 DIAGNOSIS — E785 Hyperlipidemia, unspecified: Secondary | ICD-10-CM

## 2022-05-03 ENCOUNTER — Other Ambulatory Visit: Payer: Self-pay

## 2022-05-03 DIAGNOSIS — Z6835 Body mass index (BMI) 35.0-35.9, adult: Secondary | ICD-10-CM | POA: Diagnosis not present

## 2022-05-03 DIAGNOSIS — M303 Mucocutaneous lymph node syndrome [Kawasaki]: Secondary | ICD-10-CM | POA: Diagnosis not present

## 2022-05-03 DIAGNOSIS — Q249 Congenital malformation of heart, unspecified: Secondary | ICD-10-CM | POA: Diagnosis not present

## 2022-05-03 DIAGNOSIS — E782 Mixed hyperlipidemia: Secondary | ICD-10-CM | POA: Diagnosis not present

## 2022-05-03 MED ORDER — ATORVASTATIN CALCIUM 40 MG PO TABS
40.0000 mg | ORAL_TABLET | Freq: Every day | ORAL | 3 refills | Status: DC
  Filled 2022-05-03: qty 90, 90d supply, fill #0
  Filled 2022-07-31: qty 90, 90d supply, fill #1
  Filled 2022-10-25 – 2022-10-26 (×2): qty 90, 90d supply, fill #2
  Filled 2023-01-28: qty 90, 90d supply, fill #3

## 2022-05-04 DIAGNOSIS — E782 Mixed hyperlipidemia: Secondary | ICD-10-CM | POA: Diagnosis not present

## 2022-05-04 DIAGNOSIS — I4510 Unspecified right bundle-branch block: Secondary | ICD-10-CM | POA: Diagnosis not present

## 2022-10-26 ENCOUNTER — Other Ambulatory Visit: Payer: Self-pay

## 2022-11-07 DIAGNOSIS — Z13220 Encounter for screening for lipoid disorders: Secondary | ICD-10-CM | POA: Diagnosis not present

## 2022-11-07 DIAGNOSIS — Q201 Double outlet right ventricle: Secondary | ICD-10-CM | POA: Diagnosis not present

## 2022-11-07 DIAGNOSIS — Q248 Other specified congenital malformations of heart: Secondary | ICD-10-CM | POA: Diagnosis not present

## 2022-12-06 ENCOUNTER — Encounter: Payer: Self-pay | Admitting: Family Medicine

## 2022-12-06 ENCOUNTER — Other Ambulatory Visit: Payer: Self-pay

## 2022-12-06 ENCOUNTER — Telehealth: Payer: 59 | Admitting: Family Medicine

## 2022-12-06 DIAGNOSIS — H1032 Unspecified acute conjunctivitis, left eye: Secondary | ICD-10-CM

## 2022-12-06 MED ORDER — POLYMYXIN B-TRIMETHOPRIM 10000-0.1 UNIT/ML-% OP SOLN
1.0000 [drp] | OPHTHALMIC | 0 refills | Status: DC
  Filled 2022-12-06: qty 10, 34d supply, fill #0

## 2022-12-06 NOTE — Progress Notes (Signed)
CC: Eye complaint  Vernal Hritz is here for left eye irritation. We are interacting via web portal for an electronic face-to-face visit. I verified patient's ID using 2 identifiers. Patient agreed to proceed with visit via this method. Patient is at home, I am at office. Patient and I are present for visit.   Duration: 1  d Chemical exposure? No  Recent URI? No  Vision changes? No Eye pain? Yes, stinging that gets better when he closes his L eye Contact lenses? No  History of allergies? Yes No fevers or URi s/s's.  Treatment to date: otc drops (saline?) 41 yo son gave this to him.   Past Medical History:  Diagnosis Date   Allergy    seasonal   Blood transfusion reaction    Heart murmur    Family History  Problem Relation Age of Onset   Brain cancer Father 88   Heart attack Father 20       MI   Lung cancer Father 39   Cancer Paternal Uncle        spine   Cancer Paternal Uncle        brain   Colon cancer Mother 26   Stroke Paternal Grandmother    Stroke Maternal Grandmother    Heart attack Maternal Grandfather    Prostate cancer Maternal Grandfather    Objective No conversational dyspnea Injected sclera of L eye Age appropriate judgment and insight Nml affect and mood  Acute conjunctivitis of left eye, unspecified acute conjunctivitis type - Plan: trimethoprim-polymyxin b (POLYTRIM) ophthalmic solution  Drops as above. Q 4 hrs while awake. Instructed to practice good hand hygiene and try not to touch face. Warm compresses and artificial tears also recommended. F/u prn. Pt voiced understanding and agreement to the plan.  Jilda Roche [place], DO 12/06/22 9:23 AM

## 2022-12-11 ENCOUNTER — Telehealth: Payer: Self-pay | Admitting: Family Medicine

## 2022-12-11 NOTE — Telephone Encounter (Signed)
This is even with him still on the drops?

## 2022-12-11 NOTE — Telephone Encounter (Signed)
Patient saw Alex Hamilton on 05/01 and he states his eyes got better but after the weekend the right eye started giving him trouble again. He would like advise on what to do.

## 2022-12-11 NOTE — Telephone Encounter (Signed)
Called left message to call back 

## 2022-12-12 ENCOUNTER — Ambulatory Visit: Payer: 59 | Admitting: Family Medicine

## 2022-12-12 ENCOUNTER — Other Ambulatory Visit: Payer: Self-pay

## 2022-12-12 ENCOUNTER — Encounter: Payer: Self-pay | Admitting: Family Medicine

## 2022-12-12 VITALS — BP 132/74 | HR 72 | Temp 98.1°F | Resp 18 | Ht 63.0 in | Wt 187.6 lb

## 2022-12-12 DIAGNOSIS — J0140 Acute pansinusitis, unspecified: Secondary | ICD-10-CM | POA: Insufficient documentation

## 2022-12-12 DIAGNOSIS — B9689 Other specified bacterial agents as the cause of diseases classified elsewhere: Secondary | ICD-10-CM | POA: Diagnosis not present

## 2022-12-12 DIAGNOSIS — H109 Unspecified conjunctivitis: Secondary | ICD-10-CM

## 2022-12-12 MED ORDER — AMOXICILLIN-POT CLAVULANATE 875-125 MG PO TABS
1.0000 | ORAL_TABLET | Freq: Two times a day (BID) | ORAL | 0 refills | Status: DC
  Filled 2022-12-12: qty 20, 10d supply, fill #0

## 2022-12-12 MED ORDER — MOXIFLOXACIN HCL 0.5 % OP SOLN
1.0000 [drp] | Freq: Three times a day (TID) | OPHTHALMIC | 0 refills | Status: DC
  Filled 2022-12-12: qty 3, 20d supply, fill #0

## 2022-12-12 NOTE — Telephone Encounter (Signed)
Letter done and sent through mychart

## 2022-12-12 NOTE — Progress Notes (Signed)
Subjective:   By signing my name below, I, [name], attest that this documentation has been prepared under the direction and in the presence of Donato Schultz, DO. 12/12/2022   Patient ID: Alex Hamilton, male    DOB: Jul 24, 1981, 42 y.o.   MRN: [id number]  Chief Complaint  Patient presents with   Conjunctivitis    Pt had a telehealth visit on 12/06/22 for pink eye. Pt states sxs have worsen.     Conjunctivitis  Associated symptoms include sore throat, cough and eye redness (bilateral). Pertinent negatives include no fever, no abdominal pain, no nausea, no congestion, no headaches and no rash.   Patient is in today for a office visit.   He complains of bacterial conjunctivitis and and cough with dark green mucous, pressure behind his eyes and sore throat. His respiratory symptoms started yesterday. He denies any fever, headache and tested negative for at home Covid-19 test. He reports his conjunctivitis was worse on Saturday and improve Sunday but his symptoms returned the next day. He is taking OTC allergy medication and Flonase nasal spray. His son also developed conjunctivitis but his symptoms are mild compared to patient. He scheduled an appointment with his eye doctor for a follow up for conjunctivitis.   Past Medical History:  Diagnosis Date   Allergy    seasonal   Blood transfusion reaction    Heart murmur     Past Surgical History:  Procedure Laterality Date   EXPLORATION POST OPERATIVE OPEN HEART  1992    Family History  Problem Relation Age of Onset   Brain cancer Father 40   Heart attack Father 49       MI   Lung cancer Father 13   Cancer Paternal Uncle        spine   Cancer Paternal Uncle        brain   Colon cancer Mother 57   Stroke Paternal Grandmother    Stroke Maternal Grandmother    Heart attack Maternal Grandfather    Prostate cancer Maternal Grandfather     Social History   Socioeconomic History   Marital status: Married    Spouse  name: Not on file   Number of children: Not on file   Years of education: Not on file   Highest education level: 12th grade  Occupational History   Occupation: paramedic    Employer: [employer]    Comment: [employer]  Tobacco Use   Smoking status: Never   Smokeless tobacco: Never  Vaping Use   Vaping Use: Never used  Substance and Sexual Activity   Alcohol use: Yes    Alcohol/week: 0.0 standard drinks of alcohol   Drug use: No   Sexual activity: Yes    Partners: Female  Other Topics Concern   Not on file  Social History Narrative   30 min walk / day 3x a week   Social Determinants of Health   Financial Resource Strain: Low Risk  (12/11/2022)   Overall Financial Resource Strain (CARDIA)    Difficulty of Paying Living Expenses: Not hard at all  Food Insecurity: No Food Insecurity (12/11/2022)   Hunger Vital Sign    Worried About Running Out of Food in the Last Year: Never true    Ran Out of Food in the Last Year: Never true  Transportation Needs: No Transportation Needs (12/11/2022)   PRAPARE - Administrator, Civil Service (Medical): No    Lack of Transportation (Non-Medical): No  Physical Activity: Sufficiently Active (12/11/2022)   Exercise Vital Sign    Days of Exercise per Week: 7 days    Minutes of Exercise per Session: 50 min  Stress: No Stress Concern Present (12/11/2022)   [employer] of Occupational Health - Occupational Stress Questionnaire    Feeling of Stress : Not at all  Social Connections: Moderately Integrated (12/11/2022)   Social Connection and Isolation Panel [NHANES]    Frequency of Communication with Friends and Family: More than three times a week    Frequency of Social Gatherings with Friends and Family: Three times a week    Attends Religious Services: More than 4 times per year    Active Member of Clubs or Organizations: No    Attends Engineer, structural: Not on file    Marital Status: Married  Catering manager Violence: Not on  file    Outpatient Medications Prior to Visit  Medication Sig Dispense Refill   atorvastatin (LIPITOR) 40 MG tablet Take 1 tablet (40 mg total) by mouth daily. 90 tablet 3   cetirizine (ZYRTEC) 10 MG tablet Take 10 mg by mouth daily.     fluticasone (FLONASE) 50 MCG/ACT nasal spray Place into both nostrils daily.     trimethoprim-polymyxin b (POLYTRIM) ophthalmic solution Place 1 drop into the left eye every 4 (four) hours. 10 mL 0   No facility-administered medications prior to visit.    Allergies  Allergen Reactions   Cefaclor Other (See Comments)    Unsure- mother told him of allergy tolerates amoxicillin per pt   Demerol [Meperidine] Other (See Comments)    Unsure- mother told him of allergy    Naldecon Senior [Guaifenesin] Other (See Comments)    Unsure- mother told him of allergy    Review of Systems  Constitutional:  Negative for fever and malaise/fatigue.  HENT:  Positive for sore throat. Negative for congestion.   Eyes:  Positive for redness (bilateral). Negative for blurred vision.       (+)pressure behind both eyes (+)pus developing around both eyes  Respiratory:  Positive for cough and sputum production (dark green). Negative for shortness of breath.   Cardiovascular:  Negative for chest pain, palpitations and leg swelling.  Gastrointestinal:  Negative for abdominal pain, blood in stool and nausea.  Genitourinary:  Negative for dysuria and frequency.  Musculoskeletal:  Negative for falls.  Skin:  Negative for rash.  Neurological:  Negative for dizziness, loss of consciousness and headaches.  Endo/Heme/Allergies:  Negative for environmental allergies.  Psychiatric/Behavioral:  Negative for depression. The patient is not nervous/anxious.        Objective:    Physical Exam Vitals and nursing note reviewed.  Constitutional:      General: He is not in acute distress.    Appearance: Normal appearance. He is not ill-appearing.  HENT:     Head: Normocephalic and  atraumatic.     Right Ear: External ear normal.     Left Ear: External ear normal.     Nose:     Right Sinus: Maxillary sinus tenderness and frontal sinus tenderness present.     Left Sinus: Maxillary sinus tenderness and frontal sinus tenderness present.  Eyes:     General:        Right eye: Discharge present.        Left eye: Discharge (yellow green discharge) present.    Extraocular Movements: Extraocular movements intact.     Conjunctiva/sclera:     Right eye: Right conjunctiva is injected.  Left eye: Left conjunctiva is injected. Exudate present.     Pupils: Pupils are equal, round, and reactive to light.     Comments: Bilateral erythema  Cardiovascular:     Rate and Rhythm: Normal rate and regular rhythm.     Heart sounds: Normal heart sounds. No murmur heard.    No gallop.  Pulmonary:     Effort: Pulmonary effort is normal. No respiratory distress.     Breath sounds: Normal breath sounds. No wheezing or rales.  Lymphadenopathy:     Cervical: Cervical adenopathy present.  Skin:    General: Skin is warm and dry.  Neurological:     Mental Status: He is alert and oriented to person, place, and time.  Psychiatric:        Judgment: Judgment normal.     BP 132/74 (BP Location: Left Arm, Patient Position: Sitting, Cuff Size: Normal)   Pulse 72   Temp 98.1 F (36.7 C) (Oral)   Resp 18   Ht 5\' 3"  (1.6 m)   Wt 187 lb 9.6 oz (85.1 kg)   SpO2 99%   BMI 33.23 kg/m  Wt Readings from Last 3 Encounters:  12/12/22 187 lb 9.6 oz (85.1 kg)  04/03/22 190 lb 12.8 oz (86.5 kg)  03/17/21 190 lb 3.2 oz (86.3 kg)       Assessment & Plan:  Bacterial conjunctivitis of both eyes Assessment & Plan: Vigamox gtts Augmentin bid  Keep opth app Clean all wash clothes and towels daily  Wash linens    Orders: -     Amoxicillin-Pot Clavulanate; Take 1 tablet by mouth 2 (two) times daily.  Dispense: 20 tablet; Refill: 0 -     Moxifloxacin HCl; Place 1 drop into both eyes 3 (three)  times daily.  Dispense: 3 mL; Refill: 0  Acute non-recurrent pansinusitis Assessment & Plan: Augmentin 875 mg bid x 10 days  Flonase and zyrtec daily  F/u as needed   Orders: -     Amoxicillin-Pot Clavulanate; Take 1 tablet by mouth 2 (two) times daily.  Dispense: 20 tablet; Refill: 0    I, Donato Schultz, DO, personally preformed the services described in this documentation.  All medical record entries made by the scribe were at my direction and in my presence.  I have reviewed the chart and discharge instructions (if applicable) and agree that the record reflects my personal performance and is accurate and complete. 12/12/2022   I,[name],acting as a scribe for Donato Schultz, DO.,have documented all relevant documentation on the behalf of Donato Schultz, DO,as directed by  Donato Schultz, DO while in the presence of Donato Schultz, DO.   Donato Schultz, DO

## 2022-12-12 NOTE — Assessment & Plan Note (Signed)
Augmentin 875 mg bid x 10 days  Flonase and zyrtec daily  F/u as needed

## 2022-12-12 NOTE — Assessment & Plan Note (Signed)
Vigamox gtts Augmentin bid  Keep opth app Clean all wash clothes and towels daily  Wash linens

## 2022-12-12 NOTE — Telephone Encounter (Signed)
The patient needs a work note for his visit on 5/1 and missed work on 5/4/and 5/5 all 3 days.  Can be sent through my chart.

## 2022-12-12 NOTE — Telephone Encounter (Signed)
Patient has appointment scheduled with PCP today.    [facility] Day - Client TELEPHONE ADVICE RECORD [facility] Patient Name First: Alex Last: Hamilton Initial Comment Caller states on May 1st he had eye drops prescribed for pink eye. Pt states that he has eye crust and discharged. Translation No Nurse Assessment Nurse: Floyce Stakes, RN, Orpah Cobb Date/Time (Eastern Time): 12/11/2022 5:34:49 PM Confirm and document reason for call. If symptomatic, describe symptoms. ---Caller states he was dx with conjunctivitis on 5/1 and prescribed eye drops. States he has developed some cough, nasal discharge, sore throat and fatigue that started this morning. Day 5 of 7 of abx tx. Denies temp. Does the patient have any new or worsening symptoms? ---Yes Will a triage be completed? ---Yes Related visit to physician within the last 2 weeks? ---No Does the PT have any chronic conditions? (i.e. diabetes, asthma, this includes High risk factors for pregnancy, etc.) ---Yes List chronic conditions. ---prior open heart surgery 1995. Is this a behavioral health or substance abuse call? ---No Guidelines Guideline Title Affirmed Question Affirmed Notes Nurse Date/Time (Eastern Time) Cough - Acute NonProductive [1] Using nasal washes and pain medicine > 24 hours AND [2] sinus pain (around cheekbone or eye) persists Floyce Stakes, RN, Orpah Cobb 12/11/2022 5:38:36 PM PLEASE NOTE: All timestamps contained within this report are represented as [place] Standard Time. CONFIDENTIALTY NOTICE: This fax transmission is intended only for the addressee. It contains information that is legally privileged, confidential or otherwise protected from use or disclosure. If you are not the intended recipient, you are strictly prohibited from reviewing, disclosing, copying using or disseminating any of this information or taking any action in reliance on or regarding this information. If you have received this fax in  error, please notify us immediately by telephone so that we can arrange for its return to [place]. Phone: [phone], Toll-Free: [phone], Fax: [phone] Page: 2 of 2 Call Id: [id number] Disp. Time Lamount Cohen Time) Disposition Final User 12/11/2022 5:41:44 PM See PCP within 24 Hours Yes Floyce Stakes, RN, Orpah Cobb Final Disposition 12/11/2022 5:41:44 PM See PCP within 24 Hours Yes Floyce Stakes, RN, Orpah Cobb Caller Disagree/Comply Comply Caller Understands Yes PreDisposition Go to Urgent Care/Walk-In Clinic Care Advice Given Per Guideline SEE PCP WITHIN 24 HOURS: * IF OFFICE WILL BE OPEN: You need to be examined within the next 24 hours. Call your doctor (or NP/PA) when the office opens and make an appointment. HUMIDIFIER: CALL BACK IF: * Difficulty breathing occurs * You become worse CARE ADVICE given per Cough - Acute Non-Productive (Adult) guideline. Referrals REFERRED TO PCP OFFIC

## 2022-12-12 NOTE — Telephone Encounter (Signed)
Called left message to call back 

## 2022-12-14 DIAGNOSIS — H10023 Other mucopurulent conjunctivitis, bilateral: Secondary | ICD-10-CM | POA: Diagnosis not present

## 2022-12-14 DIAGNOSIS — H10013 Acute follicular conjunctivitis, bilateral: Secondary | ICD-10-CM | POA: Diagnosis not present

## 2023-01-15 DIAGNOSIS — H10023 Other mucopurulent conjunctivitis, bilateral: Secondary | ICD-10-CM | POA: Diagnosis not present

## 2023-01-15 DIAGNOSIS — H10013 Acute follicular conjunctivitis, bilateral: Secondary | ICD-10-CM | POA: Diagnosis not present

## 2023-01-19 NOTE — Telephone Encounter (Signed)
error 

## 2023-01-29 ENCOUNTER — Other Ambulatory Visit: Payer: Self-pay

## 2023-03-06 DIAGNOSIS — Q248 Other specified congenital malformations of heart: Secondary | ICD-10-CM | POA: Diagnosis not present

## 2023-03-06 DIAGNOSIS — Q2542 Hypoplasia of aorta: Secondary | ICD-10-CM | POA: Diagnosis not present

## 2023-03-06 DIAGNOSIS — Q210 Ventricular septal defect: Secondary | ICD-10-CM | POA: Diagnosis not present

## 2023-03-07 ENCOUNTER — Encounter (INDEPENDENT_AMBULATORY_CARE_PROVIDER_SITE_OTHER): Payer: Self-pay

## 2023-04-05 ENCOUNTER — Other Ambulatory Visit: Payer: Self-pay

## 2023-04-05 ENCOUNTER — Ambulatory Visit (INDEPENDENT_AMBULATORY_CARE_PROVIDER_SITE_OTHER): Payer: 59 | Admitting: Family Medicine

## 2023-04-05 ENCOUNTER — Encounter: Payer: Self-pay | Admitting: Family Medicine

## 2023-04-05 VITALS — BP 110/84 | HR 66 | Temp 97.9°F | Resp 18 | Ht 63.0 in | Wt 196.4 lb

## 2023-04-05 DIAGNOSIS — E785 Hyperlipidemia, unspecified: Secondary | ICD-10-CM

## 2023-04-05 DIAGNOSIS — Z0000 Encounter for general adult medical examination without abnormal findings: Secondary | ICD-10-CM | POA: Diagnosis not present

## 2023-04-05 LAB — COMPREHENSIVE METABOLIC PANEL
ALT: 43 U/L (ref 0–53)
AST: 25 U/L (ref 0–37)
Albumin: 4.6 g/dL (ref 3.5–5.2)
Alkaline Phosphatase: 72 U/L (ref 39–117)
BUN: 19 mg/dL (ref 6–23)
CO2: 28 mEq/L (ref 19–32)
Calcium: 9.9 mg/dL (ref 8.4–10.5)
Chloride: 101 mEq/L (ref 96–112)
Creatinine, Ser: 0.86 mg/dL (ref 0.40–1.50)
GFR: 107.6 mL/min (ref >60.00)
Glucose, Bld: 88 mg/dL (ref 70–99)
Potassium: 4.2 mEq/L (ref 3.5–5.1)
Sodium: 138 mEq/L (ref 135–145)
Total Bilirubin: 1.2 mg/dL (ref 0.2–1.2)
Total Protein: 7.3 g/dL (ref 6.0–8.3)

## 2023-04-05 LAB — LIPID PANEL
Cholesterol: 112 mg/dL (ref 0–200)
HDL: 35.8 mg/dL — ABNORMAL LOW (ref >39.00)
LDL Cholesterol: 54 mg/dL (ref 0–99)
NonHDL: 76.37
Total CHOL/HDL Ratio: 3
Triglycerides: 114 mg/dL (ref 0.0–149.0)
VLDL: 22.8 mg/dL (ref 0.0–40.0)

## 2023-04-05 LAB — PSA: PSA: 0.51 ng/mL (ref 0.10–4.00)

## 2023-04-05 LAB — CBC WITH DIFFERENTIAL/PLATELET
Basophils Absolute: 0 10*3/uL (ref 0.0–0.1)
Basophils Relative: 0.5 % (ref 0.0–3.0)
Eosinophils Absolute: 0.4 10*3/uL (ref 0.0–0.7)
Eosinophils Relative: 4.1 % (ref 0.0–5.0)
HCT: 49.6 % (ref 39.0–52.0)
Hemoglobin: 16.3 g/dL (ref 13.0–17.0)
Lymphocytes Relative: 19.6 % (ref 12.0–46.0)
Lymphs Abs: 1.8 10*3/uL (ref 0.7–4.0)
MCHC: 32.8 g/dL (ref 30.0–36.0)
MCV: 87.9 fl (ref 78.0–100.0)
Monocytes Absolute: 1.3 10*3/uL — ABNORMAL HIGH (ref 0.1–1.0)
Monocytes Relative: 14.1 % — ABNORMAL HIGH (ref 3.0–12.0)
Neutro Abs: 5.6 10*3/uL (ref 1.4–7.7)
Neutrophils Relative %: 61.7 % (ref 43.0–77.0)
Platelets: 263 10*3/uL (ref 150.0–400.0)
RBC: 5.64 Mil/uL (ref 4.22–5.81)
RDW: 13.2 % (ref 11.5–15.5)
WBC: 9 10*3/uL (ref 4.0–10.5)

## 2023-04-05 LAB — TSH: TSH: 2.88 u[IU]/mL (ref 0.35–5.50)

## 2023-04-05 MED ORDER — ATORVASTATIN CALCIUM 40 MG PO TABS
40.0000 mg | ORAL_TABLET | Freq: Every day | ORAL | 3 refills | Status: DC
  Filled 2023-04-05 – 2023-05-02 (×2): qty 90, 90d supply, fill #0
  Filled 2023-07-26: qty 90, 90d supply, fill #1
  Filled 2023-10-31: qty 90, 90d supply, fill #2
  Filled 2024-03-10: qty 90, 90d supply, fill #3

## 2023-04-05 NOTE — Progress Notes (Signed)
Established Patient Office Visit  Subjective   Patient ID: Alex Hamilton, male    DOB: 1981/05/08  Age: 42 y.o. MRN: [id number]  Chief Complaint  Patient presents with   Annual Exam    Pt states fasting     HPI Discussed the use of AI scribe software for clinical note transcription with the patient, who gave verbal consent to proceed.  History of Present Illness   The patient, a male with a history of cardiac issues, presents for a routine check-up. He reports no new symptoms or concerns related to his cardiac health. He recently underwent a cardiac MRI at [facility], which showed no abnormalities. This was a relief to the patient as a previous doctor had suggested he might need open heart surgery. The patient also recently recovered from a dual bacterial and viral eye infection. He is currently on Lipitor, prescribed by a previous doctor, and inquires about the continuation of this medication. The patient plans to receive his flu shot at his workplace.      Patient Active Problem List   Diagnosis Date Noted   Bacterial conjunctivitis of both eyes 12/12/2022   Acute non-recurrent pansinusitis 12/12/2022   Preventative health care 03/12/2015   Past Medical History:  Diagnosis Date   Allergy    seasonal   Blood transfusion reaction    Heart murmur    Past Surgical History:  Procedure Laterality Date   EXPLORATION POST OPERATIVE OPEN HEART  1992   Social History   Tobacco Use   Smoking status: Never   Smokeless tobacco: Never  Vaping Use   Vaping status: Never Used  Substance Use Topics   Alcohol use: Yes    Alcohol/week: 0.0 standard drinks of alcohol   Drug use: No   Social History   Socioeconomic History   Marital status: Married    Spouse name: Not on file   Number of children: Not on file   Years of education: Not on file   Highest education level: 12th grade  Occupational History   Occupation: paramedic    Employer: [employer]    Comment: [employer]  Tobacco  Use   Smoking status: Never   Smokeless tobacco: Never  Vaping Use   Vaping status: Never Used  Substance and Sexual Activity   Alcohol use: Yes    Alcohol/week: 0.0 standard drinks of alcohol   Drug use: No   Sexual activity: Yes    Partners: Female  Other Topics Concern   Not on file  Social History Narrative   30 min walk / day 3x a week   Social Determinants of Health   Financial Resource Strain: Low Risk  (12/11/2022)   Overall Financial Resource Strain (CARDIA)    Difficulty of Paying Living Expenses: Not hard at all  Food Insecurity: No Food Insecurity (12/11/2022)   Hunger Vital Sign    Worried About Running Out of Food in the Last Year: Never true    Ran Out of Food in the Last Year: Never true  Transportation Needs: No Transportation Needs (12/11/2022)   PRAPARE - Administrator, Civil Service (Medical): No    Lack of Transportation (Non-Medical): No  Physical Activity: Sufficiently Active (12/11/2022)   Exercise Vital Sign    Days of Exercise per Week: 7 days    Minutes of Exercise per Session: 50 min  Stress: No Stress Concern Present (12/11/2022)   [employer] of Occupational Health - Occupational Stress Questionnaire    Feeling of  Stress : Not at all  Social Connections: Moderately Integrated (12/11/2022)   Social Connection and Isolation Panel [NHANES]    Frequency of Communication with Friends and Family: More than three times a week    Frequency of Social Gatherings with Friends and Family: Three times a week    Attends Religious Services: More than 4 times per year    Active Member of Clubs or Organizations: No    Attends Engineer, structural: Not on file    Marital Status: Married  Catering manager Violence: Not on file   Family Status  Relation Name Status   Father  Deceased at age 48       MI   Pat Uncle  Deceased   Pat Uncle  Deceased   Mother  Alive   PGM  Deceased   PGF  Deceased at age 23       MI   MGM  (Not Specified)    MGF  (Not Specified)  No partnership data on file   Family History  Problem Relation Age of Onset   Brain cancer Father 70   Heart attack Father 74       MI   Lung cancer Father 8   Cancer Paternal Uncle        spine   Cancer Paternal Uncle        brain   Colon cancer Mother 68   Stroke Paternal Grandmother    Stroke Maternal Grandmother    Heart attack Maternal Grandfather    Prostate cancer Maternal Grandfather    Allergies  Allergen Reactions   Cefaclor Other (See Comments)    Unsure- mother told him of allergy tolerates amoxicillin per pt   Demerol [Meperidine] Other (See Comments)    Unsure- mother told him of allergy    Naldecon Senior [Guaifenesin] Other (See Comments)    Unsure- mother told him of allergy      Review of Systems  Constitutional:  Negative for chills, fever and malaise/fatigue.  HENT:  Negative for congestion and hearing loss.   Eyes:  Negative for discharge.  Respiratory:  Negative for cough, sputum production and shortness of breath.   Cardiovascular:  Negative for chest pain, palpitations and leg swelling.  Gastrointestinal:  Negative for abdominal pain, blood in stool, constipation, diarrhea, heartburn, nausea and vomiting.  Genitourinary:  Negative for dysuria, frequency, hematuria and urgency.  Musculoskeletal:  Negative for back pain, falls and myalgias.  Skin:  Negative for rash.  Neurological:  Negative for dizziness, sensory change, loss of consciousness, weakness and headaches.  Endo/Heme/Allergies:  Negative for environmental allergies. Does not bruise/bleed easily.  Psychiatric/Behavioral:  Negative for depression and suicidal ideas. The patient is not nervous/anxious and does not have insomnia.       Objective:     BP 110/84 (BP Location: Left Arm, Patient Position: Sitting, Cuff Size: Normal)   Pulse 66   Temp 97.9 F (36.6 C) (Oral)   Resp 18   Ht 5\' 3"  (1.6 m)   Wt 196 lb 6.4 oz (89.1 kg)   SpO2 97%   BMI 34.79 kg/m   BP Readings from Last 3 Encounters:  04/05/23 110/84  12/12/22 132/74  04/03/22 118/82   Wt Readings from Last 3 Encounters:  04/05/23 196 lb 6.4 oz (89.1 kg)  12/12/22 187 lb 9.6 oz (85.1 kg)  04/03/22 190 lb 12.8 oz (86.5 kg)   SpO2 Readings from Last 3 Encounters:  04/05/23 97%  12/12/22 99%  04/03/22 98%  Physical Exam Vitals and nursing note reviewed.  Constitutional:      General: He is not in acute distress.    Appearance: Normal appearance. He is well-developed.  HENT:     Head: Normocephalic and atraumatic.     Right Ear: Tympanic membrane, ear canal and external ear normal. There is no impacted cerumen.     Left Ear: Tympanic membrane, ear canal and external ear normal. There is no impacted cerumen.     Nose: Nose normal.     Mouth/Throat:     Mouth: Mucous membranes are moist.     Pharynx: Oropharynx is clear. No oropharyngeal exudate or posterior oropharyngeal erythema.  Eyes:     General: No scleral icterus.       Right eye: No discharge.        Left eye: No discharge.     Conjunctiva/sclera: Conjunctivae normal.     Pupils: Pupils are equal, round, and reactive to light.  Neck:     Thyroid: No thyromegaly.     Vascular: No JVD.  Cardiovascular:     Rate and Rhythm: Normal rate and regular rhythm.     Heart sounds: Murmur heard.  Pulmonary:     Effort: Pulmonary effort is normal. No respiratory distress.     Breath sounds: Normal breath sounds.  Abdominal:     General: Bowel sounds are normal. There is no distension.     Palpations: Abdomen is soft. There is no mass.     Tenderness: There is no abdominal tenderness. There is no guarding or rebound.  Musculoskeletal:        General: Normal range of motion.     Cervical back: Normal range of motion and neck supple.     Right lower leg: No edema.     Left lower leg: No edema.  Lymphadenopathy:     Cervical: No cervical adenopathy.  Skin:    General: Skin is warm and dry.     Findings: No  erythema or rash.  Neurological:     General: No focal deficit present.     Mental Status: He is alert and oriented to person, place, and time.     Cranial Nerves: No cranial nerve deficit.     Motor: No abnormal muscle tone.     Deep Tendon Reflexes: Reflexes are normal and symmetric. Reflexes normal.  Psychiatric:        Mood and Affect: Mood normal.        Behavior: Behavior normal.        Thought Content: Thought content normal.        Judgment: Judgment normal.      No results found for any visits on 04/05/23.  Last CBC Lab Results  Component Value Date   WBC 8.2 04/03/2022   HGB 15.5 04/03/2022   HCT 46.5 04/03/2022   MCV 88.4 04/03/2022   RDW 12.8 04/03/2022   PLT 258.0 04/03/2022   Last metabolic panel Lab Results  Component Value Date   GLUCOSE 89 04/03/2022   NA 140 04/03/2022   K 4.7 04/03/2022   CL 102 04/03/2022   CO2 27 04/03/2022   BUN 12 04/03/2022   CREATININE 0.89 04/03/2022   GFR 107.24 04/03/2022   CALCIUM 9.7 04/03/2022   PROT 7.3 04/03/2022   ALBUMIN 4.6 04/03/2022   BILITOT 0.7 04/03/2022   ALKPHOS 72 04/03/2022   AST 22 04/03/2022   ALT 38 04/03/2022   Last lipids Lab Results  Component Value Date  CHOL 254 (H) 04/03/2022   HDL 37.80 (L) 04/03/2022   LDLCALC 177 (H) 04/03/2022   LDLDIRECT 153.0 03/17/2021   TRIG 197.0 (H) 04/03/2022   CHOLHDL 7 04/03/2022   Last hemoglobin A1c No results found for: "HGBA1C" Last thyroid functions Lab Results  Component Value Date   TSH 3.22 04/03/2022   Last vitamin D No results found for: "25OHVITD2", "25OHVITD3", "VD25OH" Last vitamin B12 and Folate No results found for: "VITAMINB12", "FOLATE"    The ASCVD Risk score (Arnett DK, et al., 2019) failed to calculate for the following reasons:   The valid total cholesterol range is 130 to 320 mg/dL    Assessment & Plan:   Problem List Items Addressed This Visit       Unprioritized   Preventative health care - Primary    Ghm  utd Check labs  See AVS  Health Maintenance  Topic Date Due   COVID-19 Vaccine (4 - 2023-24 season) 04/07/2022   INFLUENZA VACCINE  03/08/2023   DTaP/Tdap/Td (2 - Td or Tdap) 03/20/2027   Hepatitis C Screening  Completed   HIV Screening  Completed   HPV VACCINES  Aged Out         Relevant Orders   Lipid panel   PSA   TSH   Comprehensive metabolic panel   CBC with Differential/Platelet   Other Visit Diagnoses     Hyperlipidemia, unspecified hyperlipidemia type       Relevant Medications   atorvastatin (LIPITOR) 40 MG tablet   Other Relevant Orders   Lipid panel   Comprehensive metabolic panel     Assessment and Plan    Hyperlipidemia Patient is currently on Lipitor, prescribed by a previous provider, and is due for a refill soon. -Continue Lipitor and provide refill.  Congenital Heart Condition Recent cardiac MRI at [facility] showed no concerning findings. Patient is followed annually by cardiology for this condition. -No changes to current management.  Recent Conjunctivitis Patient had a recent episode of conjunctivitis, diagnosed as both bacterial and viral. Follow-up with eye doctor confirmed resolution. -No further action needed at this time.  General Health Maintenance -Order routine labs today. -Declined flu shot, plans to receive at work.        No follow-ups on file.    Donato Schultz, DO

## 2023-04-05 NOTE — Assessment & Plan Note (Signed)
Ghm utd Check labs  See AVS  Health Maintenance  Topic Date Due   COVID-19 Vaccine (4 - 2023-24 season) 04/07/2022   INFLUENZA VACCINE  03/08/2023   DTaP/Tdap/Td (2 - Td or Tdap) 03/20/2027   Hepatitis C Screening  Completed   HIV Screening  Completed   HPV VACCINES  Aged Out

## 2023-05-02 ENCOUNTER — Other Ambulatory Visit: Payer: Self-pay

## 2023-06-11 ENCOUNTER — Other Ambulatory Visit (HOSPITAL_COMMUNITY): Payer: Self-pay

## 2023-06-11 MED ORDER — AMOXICILLIN 500 MG PO CAPS
2000.0000 mg | ORAL_CAPSULE | ORAL | 1 refills | Status: AC
  Filled 2023-06-11: qty 8, 2d supply, fill #0

## 2023-12-06 ENCOUNTER — Other Ambulatory Visit (HOSPITAL_COMMUNITY): Payer: Self-pay

## 2023-12-06 DIAGNOSIS — M25561 Pain in right knee: Secondary | ICD-10-CM | POA: Diagnosis not present

## 2023-12-06 MED ORDER — MELOXICAM 7.5 MG PO TABS
7.5000 mg | ORAL_TABLET | Freq: Two times a day (BID) | ORAL | 0 refills | Status: DC
  Filled 2023-12-06: qty 28, 14d supply, fill #0

## 2023-12-07 ENCOUNTER — Other Ambulatory Visit (HOSPITAL_COMMUNITY): Payer: Self-pay

## 2023-12-07 ENCOUNTER — Ambulatory Visit
Admission: RE | Admit: 2023-12-07 | Discharge: 2023-12-07 | Disposition: A | Discharge status: Home / Self Care | Source: Ambulatory Visit | Attending: Medical

## 2023-12-07 ENCOUNTER — Other Ambulatory Visit: Payer: Self-pay

## 2023-12-07 ENCOUNTER — Other Ambulatory Visit: Payer: Self-pay | Admitting: Medical

## 2023-12-07 ENCOUNTER — Encounter: Payer: Self-pay | Admitting: Medical

## 2023-12-07 DIAGNOSIS — M7121 Synovial cyst of popliteal space [Baker], right knee: Secondary | ICD-10-CM | POA: Diagnosis not present

## 2023-12-07 DIAGNOSIS — R600 Localized edema: Secondary | ICD-10-CM | POA: Diagnosis not present

## 2023-12-07 DIAGNOSIS — M25561 Pain in right knee: Secondary | ICD-10-CM

## 2023-12-07 DIAGNOSIS — M25461 Effusion, right knee: Secondary | ICD-10-CM | POA: Diagnosis not present

## 2023-12-07 MED ORDER — HYDROCODONE-ACETAMINOPHEN 5-325 MG PO TABS
1.0000 | ORAL_TABLET | Freq: Three times a day (TID) | ORAL | 0 refills | Status: DC
  Filled 2023-12-07 (×2): qty 12, 4d supply, fill #0

## 2023-12-07 MED ORDER — GABAPENTIN 100 MG PO CAPS
100.0000 mg | ORAL_CAPSULE | Freq: Every day | ORAL | 0 refills | Status: DC
  Filled 2023-12-07: qty 7, 7d supply, fill #0

## 2023-12-07 MED ORDER — METHOCARBAMOL 500 MG PO TABS
500.0000 mg | ORAL_TABLET | Freq: Three times a day (TID) | ORAL | 0 refills | Status: DC
  Filled 2023-12-07: qty 21, 7d supply, fill #0

## 2023-12-10 ENCOUNTER — Other Ambulatory Visit (HOSPITAL_COMMUNITY): Payer: Self-pay

## 2023-12-10 MED ORDER — AMOXICILLIN 500 MG PO CAPS
2000.0000 mg | ORAL_CAPSULE | Freq: Once | ORAL | 1 refills | Status: AC
  Filled 2023-12-10: qty 8, 2d supply, fill #0

## 2023-12-17 ENCOUNTER — Other Ambulatory Visit (HOSPITAL_COMMUNITY): Payer: Self-pay

## 2024-01-30 ENCOUNTER — Ambulatory Visit: Payer: Self-pay | Admitting: Medical

## 2024-01-30 ENCOUNTER — Other Ambulatory Visit: Payer: Self-pay

## 2024-01-30 ENCOUNTER — Encounter: Payer: Self-pay | Admitting: Medical

## 2024-01-30 ENCOUNTER — Ambulatory Visit: Admitting: Medical

## 2024-01-30 VITALS — BP 122/78 | HR 75 | Resp 18 | Ht 63.0 in | Wt 206.0 lb

## 2024-01-30 DIAGNOSIS — M25561 Pain in right knee: Secondary | ICD-10-CM | POA: Diagnosis not present

## 2024-01-30 DIAGNOSIS — M791 Myalgia, unspecified site: Secondary | ICD-10-CM | POA: Diagnosis not present

## 2024-01-30 DIAGNOSIS — E785 Hyperlipidemia, unspecified: Secondary | ICD-10-CM

## 2024-01-30 DIAGNOSIS — R768 Other specified abnormal immunological findings in serum: Secondary | ICD-10-CM

## 2024-01-30 DIAGNOSIS — R739 Hyperglycemia, unspecified: Secondary | ICD-10-CM | POA: Diagnosis not present

## 2024-01-30 DIAGNOSIS — M2550 Pain in unspecified joint: Secondary | ICD-10-CM | POA: Diagnosis not present

## 2024-01-30 LAB — CBC WITH DIFFERENTIAL/PLATELET
Basophils Absolute: 0 10*3/uL (ref 0.0–0.1)
Basophils Relative: 0.5 % (ref 0.0–3.0)
Eosinophils Absolute: 0.1 10*3/uL (ref 0.0–0.7)
Eosinophils Relative: 2 % (ref 0.0–5.0)
HCT: 51.6 % (ref 39.0–52.0)
Hemoglobin: 17.1 g/dL — ABNORMAL HIGH (ref 13.0–17.0)
Lymphocytes Relative: 20.2 % (ref 12.0–46.0)
Lymphs Abs: 1.4 10*3/uL (ref 0.7–4.0)
MCHC: 33.2 g/dL (ref 30.0–36.0)
MCV: 86.9 fl (ref 78.0–100.0)
Monocytes Absolute: 0.8 10*3/uL (ref 0.1–1.0)
Monocytes Relative: 12.2 % — ABNORMAL HIGH (ref 3.0–12.0)
Neutro Abs: 4.5 10*3/uL (ref 1.4–7.7)
Neutrophils Relative %: 65.1 % (ref 43.0–77.0)
Platelets: 284 10*3/uL (ref 150.0–400.0)
RBC: 5.94 Mil/uL — ABNORMAL HIGH (ref 4.22–5.81)
RDW: 13.1 % (ref 11.5–15.5)
WBC: 6.9 10*3/uL (ref 4.0–10.5)

## 2024-01-30 LAB — URIC ACID: Uric Acid, Serum: 7.9 mg/dL — ABNORMAL HIGH (ref 4.0–7.8)

## 2024-01-30 LAB — C-REACTIVE PROTEIN: CRP: <1 mg/dL (ref 0.5–20.0)

## 2024-01-30 LAB — CK: Total CK: 132 U/L (ref 7–232)

## 2024-01-30 LAB — SEDIMENTATION RATE: Sed Rate: 19 mm/h — ABNORMAL HIGH (ref 0–15)

## 2024-01-30 MED ORDER — METHYLPREDNISOLONE 4 MG PO TBPK
ORAL_TABLET | ORAL | 0 refills | Status: AC
  Filled 2024-01-30 – 2024-01-31 (×2): qty 21, 6d supply, fill #0

## 2024-01-30 NOTE — Progress Notes (Signed)
 Subjective:    Patient ID: Alex Hamilton, male    DOB: 01-Sep-1980, 43 y.o.   MRN: [id number]  HPI Alex Hamilton is a 43 year old male who presents with recurrent right knee pain and joint pain in multiple areas.  He has been experiencing recurrent right knee pain since May, which began while remodeling his house. The pain initially started as mild but escalated over four days, leading to an emergency evaluation with orthopedist. An x-ray initially suggested a possible kneecap fracture, but an orthopedic specialist later confirmed it was normal. He was prescribed medication after the initial episode.  Following the initial episode, he experienced severe spasms and shaking in the leg, prompting furthre work up with CT scan revealing an effusion.   Despite these symptoms and imaging results, he proceeded with a planned vacation, during which the knee became hot and red. He self-medicated with doxycycline and amoxicillin  for three weeks, which resolved the symptoms without a hospital visit.  Now 3-4 weeks later, he reports the onset of joint pain in his hands, neck, shoulders, and elbow. Based on hand pain he stopped his statin.  He had been taking for a year for cholesterol management. The pain is described as migratory, affecting different joints at different times. He has been managing the pain with over-the-counter medications like Aleve, Advil, and Tylenol , taken on different days.  No fever or chills during the episodes of knee redness and swelling. He has not experienced muscle weakness, although he notes some muscle pain in the upper back and trapezius area.   Now having recurrent symptoms right knee pain since this tuesday, particularly on the lateral upper area  side of the right  patella,  He continues to manage the pain with Aleve, taken as recently as yesterday. Some tylenol  as well.   He has been involved in home remodeling activities, which included crawling and plumbing  work.   Review of Systems  Constitutional:  Negative for chills, fatigue and fever.  Respiratory:  Negative for cough, shortness of breath and wheezing.   Cardiovascular:  Negative for chest pain and palpitations.  Musculoskeletal:  Positive for arthralgias and myalgias.       See hpi.   Skin:        See hpi  Neurological:  Negative for dizziness and light-headedness.  Hematological:  Negative for adenopathy. Does not bruise/bleed easily.  Psychiatric/Behavioral:  Negative for behavioral problems and decreased concentration.     Past Medical History:  Diagnosis Date   Allergy    seasonal   Blood transfusion reaction    Heart murmur      Social History   Socioeconomic History   Marital status: Married    Spouse name: Not on file   Number of children: Not on file   Years of education: Not on file   Highest education level: Associate degree: occupational, Scientist, product/process development, or vocational program  Occupational History   Occupation: paramedic    Employer: [employer]    Comment: [employer]  Tobacco Use   Smoking status: Never   Smokeless tobacco: Never  Vaping Use   Vaping status: Never Used  Substance and Sexual Activity   Alcohol use: Yes    Alcohol/week: 0.0 standard drinks of alcohol   Drug use: No   Sexual activity: Yes    Partners: Female  Other Topics Concern   Not on file  Social History Narrative   30 min walk / day 3x a week   Social Drivers of Health  Financial Resource Strain: Low Risk  (01/29/2024)   Overall Financial Resource Strain (CARDIA)    Difficulty of Paying Living Expenses: Not hard at all  Food Insecurity: No Food Insecurity (01/29/2024)   Hunger Vital Sign    Worried About Running Out of Food in the Last Year: Never true    Ran Out of Food in the Last Year: Never true  Transportation Needs: No Transportation Needs (01/29/2024)   PRAPARE - Administrator, Civil Service (Medical): No    Lack of Transportation (Non-Medical): No  Physical  Activity: Sufficiently Active (01/29/2024)   Exercise Vital Sign    Days of Exercise per Week: 5 days    Minutes of Exercise per Session: 60 min  Stress: No Stress Concern Present (01/29/2024)   [employer] of Occupational Health - Occupational Stress Questionnaire    Feeling of Stress: Not at all  Social Connections: Socially Integrated (01/29/2024)   Social Connection and Isolation Panel    Frequency of Communication with Friends and Family: More than three times a week    Frequency of Social Gatherings with Friends and Family: Three times a week    Attends Religious Services: More than 4 times per year    Active Member of Clubs or Organizations: Yes    Attends Engineer, structural: More than 4 times per year    Marital Status: Married  Catering manager Violence: Not on file    Past Surgical History:  Procedure Laterality Date   EXPLORATION POST OPERATIVE OPEN HEART  1992    Family History  Problem Relation Age of Onset   Brain cancer Father 27   Heart attack Father 67       MI   Lung cancer Father 76   Cancer Paternal Uncle        spine   Cancer Paternal Uncle        brain   Colon cancer Mother 79   Stroke Paternal Grandmother    Stroke Maternal Grandmother    Heart attack Maternal Grandfather    Prostate cancer Maternal Grandfather     Allergies  Allergen Reactions   Cefaclor Other (See Comments)    Unsure- mother told him of allergy tolerates amoxicillin  per pt   Demerol [Meperidine] Other (See Comments)    Unsure- mother told him of allergy    Naldecon Senior [Guaifenesin] Other (See Comments)    Unsure- mother told him of allergy    Current Outpatient Medications on File Prior to Visit  Medication Sig Dispense Refill   amoxicillin  (AMOXIL ) 500 MG capsule Take 4 capsules (2,000 mg total) by mouth 1 hour prior to dental appointment for pre-med. 8 capsule 1   atorvastatin  (LIPITOR) 40 MG tablet Take 1 tablet (40 mg total) by mouth daily. 90  tablet 3   cetirizine (ZYRTEC) 10 MG tablet Take 10 mg by mouth daily.     fluticasone (FLONASE) 50 MCG/ACT nasal spray Place into both nostrils daily.     gabapentin  (NEURONTIN ) 100 MG capsule Take 1 capsule (100 mg total) by mouth at bedtime. 7 capsule 0   HYDROcodone -acetaminophen  (NORCO/VICODIN) 5-325 MG tablet Take 1 tablet by mouth every 8 (eight) hours. 12 tablet 0   meloxicam  (MOBIC ) 7.5 MG tablet Take 1 tablet (7.5 mg total) by mouth 2 (two) times daily with a meal for 14 days, for knee pain. 28 tablet 0   methocarbamol  (ROBAXIN ) 500 MG tablet Take 1 tablet (500 mg total) by mouth 3 (three) times daily.  21 tablet 0   No current facility-administered medications on file prior to visit.    BP 122/78   Pulse 75   Resp 18   Ht 5' 3 (1.6 m)   Wt 206 lb (93.4 kg)   SpO2 98%   BMI 36.49 kg/m        Objective:   Physical Exam  General- No acute distress. Pleasant patient. Neck- no mid spine tenderness. Faint tender rt trap. Rt shoulder- no pain presently. Hands- no obvious swelling, redness or arthritic changes. Flexion and extension of digits show no pain Rt knee- slight limited extension due to pain but no obvious swelling/effusion. Upper lateral aspect of patella small area of faint redness. No obvious warmth. Negative homans signs. Rt calf- no swollen. No edeam Lungs- Clear, even and unlabored. Heart- regular rate and rhythm. Neurologic- CNII- XII grossly intact.       Assessment & Plan:   Patient Instructions  Right knee pain Recurrent pain with minimal early  faint redness and swelling, improved with antibiotics, suggesting infectious or inflammatory cause. Current pain localized laterally near upper lateral patella. Differential includes septic arthritis, bursitis, inflammatory arthritis. - Refer to sports medicine for urgent evaluation, possible ultrasound, and fluid aspiration if necessary. - Order inflammatory markers: rheumatoid factor, ANA, C-reactive protein,  sed rate, uric acid. -cbc and sed rate are stat - Advise use of alleve and acetaminophen  for pain management.  Arthralgias Intermittent joint pain in hands, neck, shoulders, elbow post-atorvastatin  discontinuation.  Possible but not convincing statin-related myopathy maybe factor in joint pain. Labs to differentiate conditions. - Order creatinine kinase (CK) to assess for statin-related myopathy. - Order comprehensive metabolic panel to evaluate kidney function.  Hyperlipidemia Managed with atorvastatin  for one year, recently discontinued due to joint pain concerns.  -hold off presently on starting statin.  Follow up date to be determined based on lab, clinical response and when can be seen by specialist. -please call sport med as follow up on urgent referral [phone]   Time spent with patient today was  43 minutes which consisted of chart review, discussing diagnosis, work up treatment and documentation.

## 2024-01-30 NOTE — Addendum Note (Signed)
 Addended by: DORINA DALLAS HERO on: 01/30/2024 05:26 PM   Modules accepted: Orders

## 2024-01-30 NOTE — Patient Instructions (Addendum)
 Right knee pain Recurrent pain with minimal early  faint redness and swelling, improved with antibiotics, suggesting infectious or inflammatory cause. Current pain localized laterally near upper lateral patella. Differential includes septic arthritis, bursitis, inflammatory arthritis. - Refer to sports medicine for urgent evaluation, possible ultrasound, and fluid aspiration if necessary. - Order inflammatory markers: rheumatoid factor, ANA, C-reactive protein, sed rate, uric acid. -cbc and sed rate are stat - Advise use of alleve and acetaminophen  for pain management.  Arthralgias mostly with mild upper back myalgia Intermittent joint pain in hands, neck, shoulders, elbow post-atorvastatin  discontinuation.  Possible but not convincing statin-related myopathy maybe factor in joint pain. Labs to differentiate conditions. - Order creatinine kinase (CK) to assess for statin-related myopathy. - Order comprehensive metabolic panel to evaluate kidney function.  Hyperlipidemia Managed with atorvastatin  for one year, recently discontinued due to joint pain concerns.  -hold off presently on starting statin.  Follow up date to be determined based on lab, clinical response and when can be seen by specialist. -please call sport med as follow up on urgent referral [phone]

## 2024-01-31 ENCOUNTER — Other Ambulatory Visit: Payer: Self-pay

## 2024-01-31 ENCOUNTER — Other Ambulatory Visit (HOSPITAL_COMMUNITY): Payer: Self-pay

## 2024-01-31 ENCOUNTER — Telehealth: Payer: Self-pay

## 2024-01-31 ENCOUNTER — Ambulatory Visit

## 2024-01-31 DIAGNOSIS — R739 Hyperglycemia, unspecified: Secondary | ICD-10-CM

## 2024-01-31 LAB — COMPLETE METABOLIC PANEL WITHOUT GFR
AG Ratio: 1.5 (calc) (ref 1.0–2.5)
ALT: 41 U/L (ref 9–46)
AST: 23 U/L (ref 10–40)
Albumin: 4.6 g/dL (ref 3.6–5.1)
Alkaline phosphatase (APISO): 79 U/L (ref 36–130)
BUN: 22 mg/dL (ref 7–25)
CO2: 23 mmol/L (ref 20–32)
Calcium: 10 mg/dL (ref 8.6–10.3)
Chloride: 100 mmol/L (ref 98–110)
Creat: 0.94 mg/dL (ref 0.60–1.29)
Globulin: 3.1 g/dL (ref 1.9–3.7)
Glucose, Bld: 103 mg/dL — ABNORMAL HIGH (ref 65–99)
Potassium: 4.4 mmol/L (ref 3.5–5.3)
Sodium: 138 mmol/L (ref 135–146)
Total Bilirubin: 0.6 mg/dL (ref 0.2–1.2)
Total Protein: 7.7 g/dL (ref 6.1–8.1)

## 2024-01-31 LAB — HEMOGLOBIN A1C: Hgb A1c MFr Bld: 5.7 % (ref 4.6–6.5)

## 2024-01-31 NOTE — Addendum Note (Signed)
 Addended by: DORINA DALLAS HERO on: 01/31/2024 05:17 AM   Modules accepted: Orders

## 2024-01-31 NOTE — Addendum Note (Signed)
 Addended by: MAREE HUM A on: 01/31/2024 01:01 PM   Modules accepted: Orders

## 2024-01-31 NOTE — Telephone Encounter (Signed)
 Copied from CRM [id number]. Topic: Clinical - Medical Advice >> Jan 30, 2024  5:36 PM Chiquita SQUIBB wrote: Reason for CRM: Patient is calling in regarding his results he received in his mychart patient is asking if he is going to be on a maintenance  medication or if he is going to pass it off to Dr. Antonio for future medications. Please call patient back.

## 2024-02-01 ENCOUNTER — Other Ambulatory Visit: Payer: Self-pay

## 2024-02-01 ENCOUNTER — Other Ambulatory Visit (HOSPITAL_COMMUNITY): Payer: Self-pay

## 2024-02-01 MED ORDER — COLCHICINE 0.6 MG PO TABS
0.6000 mg | ORAL_TABLET | Freq: Every day | ORAL | 2 refills | Status: DC
  Filled 2024-02-01 (×2): qty 30, 30d supply, fill #0

## 2024-02-01 NOTE — Progress Notes (Unsigned)
   LILLETTE Ileana Collet, PhD, LAT, ATC acting as a scribe for Artist Lloyd, MD.  Alex Hamilton is a 43 y.o. male who presents to [facility] today for R knee pain ongoing since May. Pain started when he was remodeling his house. Pain worsened last week. Pt locates pain to ***  Pt was seen previously at [facility]  R Knee swelling: Mechanical symptoms: Aggravates: Treatments tried:  Dx testing: 01/30/24 Labs  12/07/23 R knee CT  Pertinent review of systems: ***  Relevant historical information: ***   Exam:  There were no vitals taken for this visit. General: Well Developed, well nourished, and in no acute distress.   MSK: ***    Lab and Radiology Results Results for orders placed or performed in visit on 01/31/24 (from the past 72 hours)  Hemoglobin A1c     Status: None   Collection Time: 01/31/24  3:20 PM  Result Value Ref Range   Hgb A1c MFr Bld 5.7 4.6 - 6.5 %    Comment: Glycemic Control Guidelines for People with Diabetes:Non Diabetic:  <6%Goal of Therapy: <7%Additional Action Suggested:  >8%    No results found.     Assessment and Plan: 43 y.o. male with ***   PDMP not reviewed this encounter. No orders of the defined types were placed in this encounter.  No orders of the defined types were placed in this encounter.    Discussed warning signs or symptoms. Please see discharge instructions. Patient expresses understanding.   ***

## 2024-02-01 NOTE — Addendum Note (Signed)
 Addended by: DORINA DALLAS HERO on: 02/01/2024 06:26 AM   Modules accepted: Orders

## 2024-02-01 NOTE — Telephone Encounter (Signed)
 Pt called and lvm to return call

## 2024-02-02 LAB — ANTI-NUCLEAR AB-TITER (ANA TITER)
ANA TITER: 1:80 {titer} — ABNORMAL HIGH
ANA Titer 1: 1:80 {titer} — ABNORMAL HIGH

## 2024-02-02 LAB — RHEUMATOID FACTOR: Rheumatoid fact SerPl-aCnc: <10 [IU]/mL (ref <14)

## 2024-02-02 LAB — ANA: Anti Nuclear Antibody (ANA): POSITIVE — AB

## 2024-02-02 NOTE — Addendum Note (Signed)
 Addended by: DORINA DALLAS HERO on: 02/02/2024 09:51 AM   Modules accepted: Orders

## 2024-02-04 ENCOUNTER — Ambulatory Visit (INDEPENDENT_AMBULATORY_CARE_PROVIDER_SITE_OTHER): Admitting: Family Medicine

## 2024-02-04 ENCOUNTER — Other Ambulatory Visit: Payer: Self-pay

## 2024-02-04 ENCOUNTER — Ambulatory Visit (INDEPENDENT_AMBULATORY_CARE_PROVIDER_SITE_OTHER)

## 2024-02-04 ENCOUNTER — Encounter: Payer: Self-pay | Admitting: Family Medicine

## 2024-02-04 VITALS — BP 124/88 | HR 73 | Ht 63.0 in | Wt 204.0 lb

## 2024-02-04 DIAGNOSIS — M79642 Pain in left hand: Secondary | ICD-10-CM | POA: Diagnosis not present

## 2024-02-04 DIAGNOSIS — Q741 Congenital malformation of knee: Secondary | ICD-10-CM | POA: Insufficient documentation

## 2024-02-04 DIAGNOSIS — M1A00X0 Idiopathic chronic gout, unspecified site, without tophus (tophi): Secondary | ICD-10-CM | POA: Diagnosis not present

## 2024-02-04 DIAGNOSIS — M79641 Pain in right hand: Secondary | ICD-10-CM

## 2024-02-04 DIAGNOSIS — M7989 Other specified soft tissue disorders: Secondary | ICD-10-CM | POA: Diagnosis not present

## 2024-02-04 DIAGNOSIS — M7041 Prepatellar bursitis, right knee: Secondary | ICD-10-CM | POA: Diagnosis not present

## 2024-02-04 MED ORDER — ALLOPURINOL 100 MG PO TABS
100.0000 mg | ORAL_TABLET | Freq: Every day | ORAL | 1 refills | Status: DC
  Filled 2024-02-04: qty 90, 90d supply, fill #0

## 2024-02-04 NOTE — Patient Instructions (Addendum)
 Thank you for coming in today.   Please get an Xray today before you leave   Plan for repeat labs in 1 month.  Referral placed to Rheumatology.   Start Allopurinol

## 2024-02-04 NOTE — Telephone Encounter (Signed)
 Notified via mychart.

## 2024-02-05 ENCOUNTER — Ambulatory Visit: Payer: Self-pay | Admitting: Family Medicine

## 2024-02-05 ENCOUNTER — Encounter: Payer: Self-pay | Admitting: Family Medicine

## 2024-02-05 NOTE — Progress Notes (Signed)
Left hand x-ray looks normal to radiology.

## 2024-02-05 NOTE — Progress Notes (Signed)
Right hand x-ray looks normal to radiology

## 2024-02-06 ENCOUNTER — Encounter: Payer: Self-pay | Admitting: Family Medicine

## 2024-02-06 DIAGNOSIS — Q741 Congenital malformation of knee: Secondary | ICD-10-CM

## 2024-02-06 DIAGNOSIS — M79642 Pain in left hand: Secondary | ICD-10-CM

## 2024-02-06 DIAGNOSIS — M7041 Prepatellar bursitis, right knee: Secondary | ICD-10-CM

## 2024-02-06 DIAGNOSIS — M1A00X0 Idiopathic chronic gout, unspecified site, without tophus (tophi): Secondary | ICD-10-CM

## 2024-02-07 ENCOUNTER — Other Ambulatory Visit: Payer: Self-pay | Admitting: Family Medicine

## 2024-02-07 ENCOUNTER — Telehealth: Payer: Self-pay

## 2024-02-07 DIAGNOSIS — M2550 Pain in unspecified joint: Secondary | ICD-10-CM

## 2024-02-07 DIAGNOSIS — R768 Other specified abnormal immunological findings in serum: Secondary | ICD-10-CM

## 2024-02-07 NOTE — Telephone Encounter (Signed)
 Copied from CRM [id number]. Topic: Clinical - Medication Question >> Feb 07, 2024  4:32 PM Geroldine GRADE wrote: Reason for CRM: Patient is calling because he is still having pain and would like to know if something else can be sent in until he can schedule his appointment with the Rheumatologist.  He would like a call back as soon as possible

## 2024-02-11 ENCOUNTER — Other Ambulatory Visit (HOSPITAL_COMMUNITY): Payer: Self-pay

## 2024-02-11 ENCOUNTER — Other Ambulatory Visit: Payer: Self-pay | Admitting: Family Medicine

## 2024-02-11 DIAGNOSIS — M2550 Pain in unspecified joint: Secondary | ICD-10-CM

## 2024-02-11 MED ORDER — PREDNISONE 10 MG PO TABS
ORAL_TABLET | ORAL | 0 refills | Status: AC
  Filled 2024-02-11: qty 20, 12d supply, fill #0

## 2024-02-11 NOTE — Telephone Encounter (Signed)
 See my chart message

## 2024-02-20 ENCOUNTER — Other Ambulatory Visit: Payer: Self-pay

## 2024-02-20 DIAGNOSIS — M25561 Pain in right knee: Secondary | ICD-10-CM | POA: Diagnosis not present

## 2024-02-20 DIAGNOSIS — M25461 Effusion, right knee: Secondary | ICD-10-CM | POA: Diagnosis not present

## 2024-02-20 MED ORDER — PREDNISONE 5 MG PO TABS
ORAL_TABLET | ORAL | 0 refills | Status: DC
  Filled 2024-02-20: qty 30, 12d supply, fill #0

## 2024-02-20 MED ORDER — PREDNISONE 5 MG PO TABS
ORAL_TABLET | ORAL | 0 refills | Status: DC
  Filled 2024-02-20: qty 10, 15d supply, fill #0

## 2024-03-04 DIAGNOSIS — Q210 Ventricular septal defect: Secondary | ICD-10-CM | POA: Diagnosis not present

## 2024-04-08 ENCOUNTER — Ambulatory Visit: Payer: Self-pay | Admitting: Family Medicine

## 2024-04-08 ENCOUNTER — Encounter: Payer: Self-pay | Admitting: Family Medicine

## 2024-04-08 ENCOUNTER — Ambulatory Visit (INDEPENDENT_AMBULATORY_CARE_PROVIDER_SITE_OTHER): Payer: 59 | Admitting: Family Medicine

## 2024-04-08 VITALS — BP 110/80 | HR 65 | Temp 98.0°F | Resp 18 | Ht 63.0 in | Wt 205.0 lb

## 2024-04-08 DIAGNOSIS — Z0184 Encounter for antibody response examination: Secondary | ICD-10-CM | POA: Diagnosis not present

## 2024-04-08 DIAGNOSIS — Z0000 Encounter for general adult medical examination without abnormal findings: Secondary | ICD-10-CM | POA: Diagnosis not present

## 2024-04-08 DIAGNOSIS — Q210 Ventricular septal defect: Secondary | ICD-10-CM | POA: Insufficient documentation

## 2024-04-08 DIAGNOSIS — E785 Hyperlipidemia, unspecified: Secondary | ICD-10-CM | POA: Diagnosis not present

## 2024-04-08 DIAGNOSIS — Z125 Encounter for screening for malignant neoplasm of prostate: Secondary | ICD-10-CM

## 2024-04-08 LAB — COMPREHENSIVE METABOLIC PANEL WITH GFR
ALT: 33 U/L (ref 0–53)
AST: 22 U/L (ref 0–37)
Albumin: 4.3 g/dL (ref 3.5–5.2)
Alkaline Phosphatase: 77 U/L (ref 39–117)
BUN: 10 mg/dL (ref 6–23)
CO2: 26 meq/L (ref 19–32)
Calcium: 9 mg/dL (ref 8.4–10.5)
Chloride: 103 meq/L (ref 96–112)
Creatinine, Ser: 0.89 mg/dL (ref 0.40–1.50)
GFR: 105.74 mL/min (ref >60.00)
Glucose, Bld: 95 mg/dL (ref 70–99)
Potassium: 4 meq/L (ref 3.5–5.1)
Sodium: 140 meq/L (ref 135–145)
Total Bilirubin: 0.6 mg/dL (ref 0.2–1.2)
Total Protein: 6.9 g/dL (ref 6.0–8.3)

## 2024-04-08 LAB — TSH: TSH: 3.29 u[IU]/mL (ref 0.35–5.50)

## 2024-04-08 LAB — CBC WITH DIFFERENTIAL/PLATELET
Basophils Absolute: 0.1 K/uL (ref 0.0–0.1)
Basophils Relative: 0.8 % (ref 0.0–3.0)
Eosinophils Absolute: 0.3 K/uL (ref 0.0–0.7)
Eosinophils Relative: 4.3 % (ref 0.0–5.0)
HCT: 44.9 % (ref 39.0–52.0)
Hemoglobin: 15.2 g/dL (ref 13.0–17.0)
Lymphocytes Relative: 25.1 % (ref 12.0–46.0)
Lymphs Abs: 1.9 K/uL (ref 0.7–4.0)
MCHC: 33.8 g/dL (ref 30.0–36.0)
MCV: 86.3 fl (ref 78.0–100.0)
Monocytes Absolute: 0.9 K/uL (ref 0.1–1.0)
Monocytes Relative: 11.9 % (ref 3.0–12.0)
Neutro Abs: 4.4 K/uL (ref 1.4–7.7)
Neutrophils Relative %: 57.9 % (ref 43.0–77.0)
Platelets: 297 K/uL (ref 150.0–400.0)
RBC: 5.2 Mil/uL (ref 4.22–5.81)
RDW: 13.1 % (ref 11.5–15.5)
WBC: 7.6 K/uL (ref 4.0–10.5)

## 2024-04-08 LAB — PSA: PSA: 0.51 ng/mL (ref 0.10–4.00)

## 2024-04-08 LAB — LIPID PANEL
Cholesterol: 103 mg/dL (ref 0–200)
HDL: 32.9 mg/dL — ABNORMAL LOW (ref >39.00)
LDL Cholesterol: 52 mg/dL (ref 0–99)
NonHDL: 69.6
Total CHOL/HDL Ratio: 3
Triglycerides: 86 mg/dL (ref 0.0–149.0)
VLDL: 17.2 mg/dL (ref 0.0–40.0)

## 2024-04-08 NOTE — Assessment & Plan Note (Signed)
 Per cardiology/ [facility]

## 2024-04-08 NOTE — Assessment & Plan Note (Signed)
 Ghm utd Check labs  See AVS Health Maintenance  Topic Date Due   Hepatitis B Vaccines 19-59 Average Risk (1 of 3 - 19+ 3-dose series) Never done   HPV VACCINES (1 - 3-dose SCDM series) Never done   INFLUENZA VACCINE  03/07/2024   COVID-19 Vaccine (4 - 2025-26 season) 04/07/2024   DTaP/Tdap/Td (2 - Td or Tdap) 03/20/2027   Hepatitis C Screening  Completed   HIV Screening  Completed   Pneumococcal Vaccine  Aged Out   Meningococcal B Vaccine  Aged Out

## 2024-04-08 NOTE — Assessment & Plan Note (Signed)
 Check labs Encourage heart healthy diet such as MIND or DASH diet, increase exercise, avoid trans fats, simple carbohydrates and processed foods, consider a krill or fish or flaxseed oil cap daily.

## 2024-04-08 NOTE — Progress Notes (Signed)
 Subjective:    Patient ID: Alex Hamilton, male    DOB: Oct 06, 1980, 43 y.o.   MRN: [id number]  Chief Complaint  Patient presents with   Annual Exam    Pt states fasting     HPI Patient is in today for cpe.  Discussed the use of AI scribe software for clinical note transcription with the patient, who gave verbal consent to proceed.  History of Present Illness Alex Hamilton is a 43 year old male who presents for an annual physical exam.  He experienced a recent episode of knee swelling, which was severe enough to prevent joint movement. The condition was initially evaluated at [facility], where gout or an infection was suspected, but the exact cause was not determined. He was treated with prednisone  and later assessed by rheumatology at [facility], where an infection was considered a possible cause. Tests for Lyme disease were negative. The symptoms have since resolved completely, and he reports being 'a hundred percent better'.  He is physically active, walking ten to fifteen thousand steps a day in the hospital. No concerns about moles or other skin issues.  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 He uses [employer] and aims to complete the curriculum in about six months. His son is also involved in gymnastics and swim school, where he has shown significant progress.    Past Medical History:  Diagnosis Date   Allergy    seasonal   Blood transfusion reaction    Heart murmur    VSD (ventricular septal defect)     Past Surgical History:  Procedure Laterality Date   EXPLORATION POST OPERATIVE OPEN HEART  1992    Family History  Problem Relation Age of Onset   Brain cancer Father 44   Heart attack Father 17       MI   Lung cancer Father 84   Cancer Paternal Uncle        spine   Cancer Paternal Uncle        brain   Colon cancer Mother 75   Stroke Paternal Grandmother    Stroke Maternal Grandmother    Heart attack Maternal Grandfather    Prostate  cancer Maternal Grandfather     Social History   Socioeconomic History   Marital status: Married    Spouse name: Not on file   Number of children: Not on file   Years of education: Not on file   Highest education level: Associate degree: occupational, Scientist, product/process development, or vocational program  Occupational History   Occupation: paramedic    Employer: [employer]    Comment: [employer]  Tobacco Use   Smoking status: Never   Smokeless tobacco: Never  Vaping Use   Vaping status: Never Used  Substance and Sexual Activity   Alcohol use: Yes    Alcohol/week: 0.0 standard drinks of alcohol   Drug use: No   Sexual activity: Yes    Partners: Female  Other Topics Concern   Not on file  Social History Narrative   30 min walk / day 3x a week   Social Drivers of Corporate investment banker Strain: Low Risk  (01/29/2024)   Overall Financial Resource Strain (CARDIA)    Difficulty of Paying Living Expenses: Not hard at all  Food Insecurity: No Food Insecurity (01/29/2024)   Hunger Vital Sign    Worried About Running Out of Food in the Last Year: Never true    Ran Out of Food in the Last  Year: Never true  Transportation Needs: No Transportation Needs (01/29/2024)   PRAPARE - Administrator, Civil Service (Medical): No    Lack of Transportation (Non-Medical): No  Physical Activity: Sufficiently Active (01/29/2024)   Exercise Vital Sign    Days of Exercise per Week: 5 days    Minutes of Exercise per Session: 60 min  Stress: No Stress Concern Present (01/29/2024)   [employer] of Occupational Health - Occupational Stress Questionnaire    Feeling of Stress: Not at all  Social Connections: Socially Integrated (01/29/2024)   Social Connection and Isolation Panel    Frequency of Communication with Friends and Family: More than three times a week    Frequency of Social Gatherings with Friends and Family: Three times a week    Attends Religious Services: More than 4 times per year     Active Member of Clubs or Organizations: Yes    Attends Banker Meetings: More than 4 times per year    Marital Status: Married  Catering manager Violence: Not on file    Outpatient Medications Prior to Visit  Medication Sig Dispense Refill   amoxicillin  (AMOXIL ) 500 MG capsule Take 4 capsules (2,000 mg total) by mouth 1 hour prior to dental appointment for pre-med. 8 capsule 1   atorvastatin  (LIPITOR) 40 MG tablet Take 1 tablet (40 mg total) by mouth daily. 90 tablet 3   cetirizine (ZYRTEC) 10 MG tablet Take 10 mg by mouth daily.     fluticasone (FLONASE) 50 MCG/ACT nasal spray Place into both nostrils daily.     allopurinol  (ZYLOPRIM ) 100 MG tablet Take 1 tablet (100 mg total) by mouth daily. 90 tablet 1   colchicine  0.6 MG tablet Take 1 tablet (0.6 mg total) by mouth daily. 30 tablet 2   gabapentin  (NEURONTIN ) 100 MG capsule Take 1 capsule (100 mg total) by mouth at bedtime. 7 capsule 0   HYDROcodone -acetaminophen  (NORCO/VICODIN) 5-325 MG tablet Take 1 tablet by mouth every 8 (eight) hours. 12 tablet 0   meloxicam  (MOBIC ) 7.5 MG tablet Take 1 tablet (7.5 mg total) by mouth 2 (two) times daily with a meal for 14 days, for knee pain. 28 tablet 0   methocarbamol  (ROBAXIN ) 500 MG tablet Take 1 tablet (500 mg total) by mouth 3 (three) times daily. 21 tablet 0   predniSONE  (DELTASONE ) 5 MG tablet Take 4 tablets (20 mg total) by mouth daily for 3 days, THEN 3 tablets (15 mg total) daily for 3 days, THEN 2 tablets (10 mg total) daily for 3 days, THEN 1 tablet (5 mg total) daily for 3 days. (PRED TAPER FOR FLARE UP ONLY). 30 tablet 0   predniSONE  (DELTASONE ) 5 MG tablet Take 1 tablet (5 mg total) by mouth daily for 5 days, THEN 0.5 tablets (2.5 mg total) daily for 5 days, THEN 0.5 tablets (2.5 mg total) every other day for 5 days. 10 tablet 0   No facility-administered medications prior to visit.    Allergies  Allergen Reactions   Cefaclor Other (See Comments)    Unsure- mother told  him of allergy tolerates amoxicillin  per pt   Demerol [Meperidine] Other (See Comments)    Unsure- mother told him of allergy    Naldecon Senior [Guaifenesin] Other (See Comments)    Unsure- mother told him of allergy    Review of Systems  Constitutional:  Negative for chills, fever and malaise/fatigue.  HENT:  Negative for congestion and hearing loss.   Eyes:  Negative  for blurred vision and discharge.  Respiratory:  Negative for cough, sputum production and shortness of breath.   Cardiovascular:  Negative for chest pain, palpitations and leg swelling.  Gastrointestinal:  Negative for abdominal pain, blood in stool, constipation, diarrhea, heartburn, nausea and vomiting.  Genitourinary:  Negative for dysuria, frequency, hematuria and urgency.  Musculoskeletal:  Negative for back pain, falls and myalgias.  Skin:  Negative for rash.  Neurological:  Negative for dizziness, sensory change, loss of consciousness, weakness and headaches.  Endo/Heme/Allergies:  Negative for environmental allergies. Does not bruise/bleed easily.  Psychiatric/Behavioral:  Negative for depression and suicidal ideas. The patient is not nervous/anxious and does not have insomnia.        Objective:    Physical Exam Vitals and nursing note reviewed.  Constitutional:      General: He is not in acute distress.    Appearance: Normal appearance. He is well-developed.  HENT:     Head: Normocephalic and atraumatic.     Right Ear: Tympanic membrane, ear canal and external ear normal. There is no impacted cerumen.     Left Ear: Tympanic membrane, ear canal and external ear normal. There is no impacted cerumen.     Nose: Nose normal.     Mouth/Throat:     Mouth: Mucous membranes are moist.     Pharynx: Oropharynx is clear. No oropharyngeal exudate or posterior oropharyngeal erythema.  Eyes:     General: No scleral icterus.       Right eye: No discharge.        Left eye: No discharge.     Conjunctiva/sclera:  Conjunctivae normal.     Pupils: Pupils are equal, round, and reactive to light.  Neck:     Thyroid : No thyromegaly.     Vascular: No JVD.  Cardiovascular:     Rate and Rhythm: Normal rate and regular rhythm.     Heart sounds: Murmur heard.  Pulmonary:     Effort: Pulmonary effort is normal. No respiratory distress.     Breath sounds: Normal breath sounds.  Abdominal:     General: Bowel sounds are normal. There is no distension.     Palpations: Abdomen is soft. There is no mass.     Tenderness: There is no abdominal tenderness. There is no guarding or rebound.  Musculoskeletal:        General: Normal range of motion.     Cervical back: Normal range of motion and neck supple.     Right lower leg: No edema.     Left lower leg: No edema.  Lymphadenopathy:     Cervical: No cervical adenopathy.  Skin:    General: Skin is warm and dry.     Findings: No erythema or rash.  Neurological:     Mental Status: He is alert and oriented to person, place, and time.     Cranial Nerves: No cranial nerve deficit.     Motor: No abnormal muscle tone.     Deep Tendon Reflexes: Reflexes are normal and symmetric. Reflexes normal.  Psychiatric:        Mood and Affect: Mood normal.        Behavior: Behavior normal.        Thought Content: Thought content normal.        Judgment: Judgment normal.     BP 110/80 (BP Location: Left Arm, Patient Position: Sitting, Cuff Size: Large)   Pulse 65   Temp 98 F (36.7 C) (Oral)   Resp 18  Ht 5' 3 (1.6 m)   Wt 205 lb (93 kg)   SpO2 97%   BMI 36.31 kg/m  Wt Readings from Last 3 Encounters:  04/08/24 205 lb (93 kg)  02/04/24 204 lb (92.5 kg)  01/30/24 206 lb (93.4 kg)    Diabetic Foot Exam - Simple   No data filed    Lab Results  Component Value Date   WBC 6.9 01/30/2024   HGB 17.1 (H) 01/30/2024   HCT 51.6 01/30/2024   PLT 284.0 01/30/2024   GLUCOSE 103 (H) 01/30/2024   CHOL 112 04/05/2023   TRIG 114.0 04/05/2023   HDL 35.80 (L)  04/05/2023   LDLDIRECT 153.0 03/17/2021   LDLCALC 54 04/05/2023   ALT 41 01/30/2024   AST 23 01/30/2024   NA 138 01/30/2024   K 4.4 01/30/2024   CL 100 01/30/2024   CREATININE 0.94 01/30/2024   BUN 22 01/30/2024   CO2 23 01/30/2024   TSH 2.88 04/05/2023   PSA 0.51 04/05/2023   HGBA1C 5.7 01/31/2024    Lab Results  Component Value Date   TSH 2.88 04/05/2023   Lab Results  Component Value Date   WBC 6.9 01/30/2024   HGB 17.1 (H) 01/30/2024   HCT 51.6 01/30/2024   MCV 86.9 01/30/2024   PLT 284.0 01/30/2024   Lab Results  Component Value Date   NA 138 01/30/2024   K 4.4 01/30/2024   CO2 23 01/30/2024   GLUCOSE 103 (H) 01/30/2024   BUN 22 01/30/2024   CREATININE 0.94 01/30/2024   BILITOT 0.6 01/30/2024   ALKPHOS 72 04/05/2023   AST 23 01/30/2024   ALT 41 01/30/2024   PROT 7.7 01/30/2024   ALBUMIN 4.6 04/05/2023   CALCIUM  10.0 01/30/2024   GFR 107.60 04/05/2023   Lab Results  Component Value Date   CHOL 112 04/05/2023   Lab Results  Component Value Date   HDL 35.80 (L) 04/05/2023   Lab Results  Component Value Date   LDLCALC 54 04/05/2023   Lab Results  Component Value Date   TRIG 114.0 04/05/2023   Lab Results  Component Value Date   CHOLHDL 3 04/05/2023   Lab Results  Component Value Date   HGBA1C 5.7 01/31/2024       Assessment & Plan:  Preventative health care Assessment & Plan: Ghm utd Check labs  See AVS Health Maintenance  Topic Date Due   Hepatitis B Vaccines 19-59 Average Risk (1 of 3 - 19+ 3-dose series) Never done   HPV VACCINES (1 - 3-dose SCDM series) Never done   INFLUENZA VACCINE  03/07/2024   COVID-19 Vaccine (4 - 2025-26 season) 04/07/2024   DTaP/Tdap/Td (2 - Td or Tdap) 03/20/2027   Hepatitis C Screening  Completed   HIV Screening  Completed   Pneumococcal Vaccine  Aged Out   Meningococcal B Vaccine  Aged Out     Orders: -     Lipid panel -     PSA -     TSH -     Comprehensive metabolic panel with GFR -      CBC with Differential/Platelet  Hyperlipidemia, unspecified hyperlipidemia type Assessment & Plan: Check labs  Encourage heart healthy diet such as MIND or DASH diet, increase exercise, avoid trans fats, simple carbohydrates and processed foods, consider a krill or fish or flaxseed oil cap daily.     Immunity status testing -     Measles/Mumps/Rubella Immunity  VSD (ventricular septal defect) Assessment & Plan: Per cardiology/  [facility]    Assessment and Plan Assessment & Plan Adult Wellness Visit   Routine adult wellness visit with no significant changes in family history. Recent leg issue resolved; tests for gout, infection, and Lyme disease were negative. No current joint issues or concerns about moles. He exercises regularly through work activities. Declines further COVID vaccination, perceiving current COVID as similar to a common cold. No recent exposure to measles, but potential exposure to monkeypox noted. Check MMR titers due to potential exposure risks. Administer flu shot at work.    Sheldon Sem R Lowne Chase, DO

## 2024-04-09 LAB — MEASLES/MUMPS/RUBELLA IMMUNITY
Mumps IgG: 142 [AU]/ml
Rubella: 1.53 {index}
Rubeola IgG: 85.5 [AU]/ml

## 2024-06-09 ENCOUNTER — Other Ambulatory Visit: Payer: Self-pay

## 2024-06-15 ENCOUNTER — Other Ambulatory Visit: Payer: Self-pay | Admitting: Family Medicine

## 2024-06-15 DIAGNOSIS — E785 Hyperlipidemia, unspecified: Secondary | ICD-10-CM

## 2024-06-16 ENCOUNTER — Other Ambulatory Visit: Payer: Self-pay

## 2024-06-16 MED ORDER — ATORVASTATIN CALCIUM 40 MG PO TABS
40.0000 mg | ORAL_TABLET | Freq: Every day | ORAL | 3 refills | Status: AC
  Filled 2024-06-16: qty 90, 90d supply, fill #0
  Filled 2024-09-10: qty 90, 90d supply, fill #1

## 2024-07-22 ENCOUNTER — Other Ambulatory Visit: Payer: Self-pay

## 2024-07-22 MED ORDER — AMOXICILLIN 500 MG PO CAPS
2000.0000 mg | ORAL_CAPSULE | ORAL | 1 refills | Status: AC
  Filled 2024-07-22: qty 8, 2d supply, fill #0

## 2024-08-19 ENCOUNTER — Other Ambulatory Visit: Payer: Self-pay

## 2024-08-20 ENCOUNTER — Other Ambulatory Visit: Payer: Self-pay

## 2024-09-10 ENCOUNTER — Other Ambulatory Visit (HOSPITAL_COMMUNITY): Payer: Self-pay

## 2024-09-11 ENCOUNTER — Other Ambulatory Visit: Payer: Self-pay

## 2025-04-10 ENCOUNTER — Encounter: Admitting: Family Medicine
